# Patient Record
Sex: Male | Born: 1995 | Race: White | Hispanic: No | Marital: Married | Smoking: Never smoker
Health system: Southern US, Community
[De-identification: ages and names within clinical notes are randomized; demographics above are authoritative.]

## PROBLEM LIST (undated history)

## (undated) DIAGNOSIS — K921 Melena: Secondary | ICD-10-CM

## (undated) DIAGNOSIS — M1990 Unspecified osteoarthritis, unspecified site: Secondary | ICD-10-CM

## (undated) HISTORY — DX: Unspecified osteoarthritis, unspecified site: M19.90

## (undated) HISTORY — DX: Melena: K92.1

---
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
## 2015-10-28 ENCOUNTER — Ambulatory Visit (INDEPENDENT_AMBULATORY_CARE_PROVIDER_SITE_OTHER): Payer: Self-pay | Admitting: Physician Assistant

## 2015-10-28 VITALS — BP 120/76 | HR 55 | Temp 98.1°F | Resp 18 | Ht 70.0 in | Wt 155.0 lb

## 2015-10-28 DIAGNOSIS — L02214 Cutaneous abscess of groin: Secondary | ICD-10-CM

## 2015-10-28 MED ORDER — DOXYCYCLINE HYCLATE 100 MG PO CAPS: 100.0000 mg | ORAL_CAPSULE | Freq: Two times a day (BID) | ORAL | Status: DC

## 2015-10-28 NOTE — Patient Instructions (Addendum)
Keep area covered with a bandaid until completely healed. You can wash with soap and water but cover it up afterwards. Use heat on the affected area (you can use a sock with rice in it, heat it up in the microwave for 30-45 sec) use a barrier between your skin and the heat.   Follow up in 48 hours if no improvement, seek medical care sooner if symptoms worsen.   Take doxycycline as prescribed until the entire course is finished. Avoid direct sunlight while taking the medication; use sunscreen while outside.    Abscess An abscess is an infected area that contains a collection of pus and debris.It can occur in almost any part of the body. An abscess is also known as a furuncle or boil. CAUSES  An abscess occurs when tissue gets infected. This can occur from blockage of oil or sweat glands, infection of hair follicles, or a minor injury to the skin. As the body tries to fight the infection, pus collects in the area and creates pressure under the skin. This pressure causes pain. People with weakened immune systems have difficulty fighting infections and get certain abscesses more often.  SYMPTOMS Usually an abscess develops on the skin and becomes a painful mass that is red, warm, and tender. If the abscess forms under the skin, you may feel a moveable soft area under the skin. Some abscesses break open (rupture) on their own, but most will continue to get worse without care. The infection can spread deeper into the body and eventually into the bloodstream, causing you to feel ill.  DIAGNOSIS  Your caregiver will take your medical history and perform a physical exam. A sample of fluid may also be taken from the abscess to determine what is causing your infection. TREATMENT  Your caregiver may prescribe antibiotic medicines to fight the infection. However, taking antibiotics alone usually does not cure an abscess. Your caregiver may need to make a small cut (incision) in the abscess to drain the pus. In  some cases, gauze is packed into the abscess to reduce pain and to continue draining the area. HOME CARE INSTRUCTIONS   Only take over-the-counter or prescription medicines for pain, discomfort, or fever as directed by your caregiver.  If you were prescribed antibiotics, take them as directed. Finish them even if you start to feel better.  If gauze is used, follow your caregiver's directions for changing the gauze.  To avoid spreading the infection:  Keep your draining abscess covered with a bandage.  Wash your hands well.  Do not share personal care items, towels, or whirlpools with others.  Avoid skin contact with others.  Keep your skin and clothes clean around the abscess.  Keep all follow-up appointments as directed by your caregiver. SEEK MEDICAL CARE IF:   You have increased pain, swelling, redness, fluid drainage, or bleeding.  You have muscle aches, chills, or a general ill feeling.  You have a fever. MAKE SURE YOU:   Understand these instructions.  Will watch your condition.  Will get help right away if you are not doing well or get worse.   This information is not intended to replace advice given to you by your health care provider. Make sure you discuss any questions you have with your health care provider.   Document Released: 01/18/2005 Document Revised: 10/10/2011 Document Reviewed: 06/23/2011 Elsevier Interactive Patient Education 2016 [employer]   Doxycycline tablets or capsules What is this medicine? DOXYCYCLINE (dox i SYE kleen) is a tetracycline  antibiotic. It kills certain bacteria or stops their growth. It is used to treat many kinds of infections, like dental, skin, respiratory, and urinary tract infections. It also treats acne, Lyme disease, malaria, and certain sexually transmitted infections. This medicine may be used for other purposes; ask your health care provider or pharmacist if you have questions. What should I tell my health care  provider before I take this medicine? They need to know if you have any of these conditions: -liver disease -long exposure to sunlight like working outdoors -stomach problems like colitis -an unusual or allergic reaction to doxycycline, tetracycline antibiotics, other medicines, foods, dyes, or preservatives -pregnant or trying to get pregnant -breast-feeding How should I use this medicine? Take this medicine by mouth with a full glass of water. Follow the directions on the prescription label. It is best to take this medicine without food, but if it upsets your stomach take it with food. Take your medicine at regular intervals. Do not take your medicine more often than directed. Take all of your medicine as directed even if you think you are better. Do not skip doses or stop your medicine early. Talk to your pediatrician regarding the use of this medicine in children. While this drug may be prescribed for selected conditions, precautions do apply. Overdosage: If you think you have taken too much of this medicine contact a poison control center or emergency room at once. NOTE: This medicine is only for you. Do not share this medicine with others. What if I miss a dose? If you miss a dose, take it as soon as you can. If it is almost time for your next dose, take only that dose. Do not take double or extra doses. What may interact with this medicine? -antacids -barbiturates -birth control pills -bismuth subsalicylate -carbamazepine -methoxyflurane -other antibiotics -phenytoin -vitamins that contain iron -warfarin This list may not describe all possible interactions. Give your health care provider a list of all the medicines, herbs, non-prescription drugs, or dietary supplements you use. Also tell them if you smoke, drink alcohol, or use illegal drugs. Some items may interact with your medicine. What should I watch for while using this medicine? Tell your doctor or health care professional  if your symptoms do not improve. Do not treat diarrhea with over the counter products. Contact your doctor if you have diarrhea that lasts more than 2 days or if it is severe and watery. Do not take this medicine just before going to bed. It may not dissolve properly when you lay down and can cause pain in your throat. Drink plenty of fluids while taking this medicine to also help reduce irritation in your throat. This medicine can make you more sensitive to the sun. Keep out of the sun. If you cannot avoid being in the sun, wear protective clothing and use sunscreen. Do not use sun lamps or tanning beds/booths. Birth control pills may not work properly while you are taking this medicine. Talk to your doctor about using an extra method of birth control. If you are being treated for a sexually transmitted infection, avoid sexual contact until you have finished your treatment. Your sexual partner may also need treatment. Avoid antacids, aluminum, calcium, magnesium, and iron products for 4 hours before and 2 hours after taking a dose of this medicine. If you are using this medicine to prevent malaria, you should still protect yourself from contact with mosquitos. Stay in screened-in areas, use mosquito nets, keep your body covered, and use  an insect repellent. What side effects may I notice from receiving this medicine? Side effects that you should report to your doctor or health care professional as soon as possible: -allergic reactions like skin rash, itching or hives, swelling of the face, lips, or tongue -difficulty breathing -fever -itching in the rectal or genital area -pain on swallowing -redness, blistering, peeling or loosening of the skin, including inside the mouth -severe stomach pain or cramps -unusual bleeding or bruising -unusually weak or tired -yellowing of the eyes or skin Side effects that usually do not require medical attention (report to your doctor or health care professional  if they continue or are bothersome): -diarrhea -loss of appetite -nausea, vomiting This list may not describe all possible side effects. Call your doctor for medical advice about side effects. You may report side effects to FDA at 1-800-FDA-1088. Where should I keep my medicine? Keep out of the reach of children. Store at room temperature, below 30 degrees C (86 degrees F). Protect from light. Keep container tightly closed. Throw away any unused medicine after the expiration date. Taking this medicine after the expiration date can make you seriously ill. NOTE: This sheet is a summary. It may not cover all possible information. If you have questions about this medicine, talk to your doctor, pharmacist, or health care provider.    2016, Elsevier/Gold Standard. (2014-07-31 12:10:28)   IF you received an x-ray today, you will receive an invoice from [facility]. Please contact [facility] at [phone] with questions or concerns regarding your invoice.   IF you received labwork today, you will receive an invoice from [facility]. Please contact [facility] at [phone] with questions or concerns regarding your invoice.   Our billing staff will not be able to assist you with questions regarding bills from these companies.  You will be contacted with the lab results as soon as they are available. The fastest way to get your results is to activate your My Chart account. Instructions are located on the last page of this paperwork. If you have not heard from us regarding the results in 2 weeks, please contact this office.

## 2015-10-28 NOTE — Progress Notes (Signed)
   Shawn Strickland  MRN: [id number] DOB: [birth year]  Subjective:  Shawn Strickland  is a 20 y.o. male seen in office today for a chief complaint of cyst in right groin area that appeared about a week and a half ago. It opened by itself and a lot of pus was expressed however it has gotten progressively bigger in size. Yesterday, he noticed another one on his left thigh. He has never had anything like this before and has no history of MRSA. Pt has not tried anything for relief.   They are very painful, he has noticed a lot pus drainage from both sites. No fever, fatigue, or recent illness.   Does not report any tick or spider bites that he is aware of.   There are no active problems to display for this patient.   No current outpatient prescriptions on file prior to visit.   No current facility-administered medications on file prior to visit.    No Known Allergies  Review of Systems Objective:  BP 120/76 mmHg  Pulse 55  Temp(Src) 98.1 F (36.7 C) (Oral)  Resp 18  Ht 5\' 10"  (1.778 m)  Wt 155 lb (70.308 kg)  BMI 22.24 kg/m2  SpO2 100%  Physical Exam  Constitutional: He is oriented to person, place, and time and well-developed, well-nourished, and in no distress.  HENT:  Head: Normocephalic and atraumatic.  Eyes: Conjunctivae are normal.  Neck: Normal range of motion.  Pulmonary/Chest: Effort normal.  Neurological: He is alert and oriented to person, place, and time. Gait normal.  Skin: Skin is warm and dry.     Psychiatric: Affect normal.  Vitals reviewed.   Assessment and Plan :   1. Groin abscess -Concern for MRSA -Wound opening small, but due to adequate drainage no I&D was performed -Patient instructed if no improvement in 48 hrs to return to clinic -Informed to keep covered until completely healed and to use warm compresses on the area 3-4 times a day.  -WOUND CULTURE - doxycycline (VIBRAMYCIN) 100 MG capsule; Take 1 capsule (100 mg total) by mouth 2 (two) times  daily.  Dispense: 20 capsule; Refill: 0; informed to avoid direct sunlight and to use sunscreen while outside on this medication   Benjiman CoreBrittany Billye Pickerel PA-C  [facility] 10/28/2015 9:14 AM

## 2015-10-30 LAB — WOUND CULTURE
GRAM STAIN: NONE SEEN
Gram Stain: NONE SEEN

## 2015-11-02 ENCOUNTER — Encounter: Payer: Self-pay | Admitting: Physician Assistant

## 2016-09-01 ENCOUNTER — Emergency Department
Admission: EM | Admit: 2016-09-01 | Discharge: 2016-09-01 | Disposition: A | Discharge status: Home / Self Care | Payer: Self-pay | Attending: Emergency Medicine | Admitting: Emergency Medicine

## 2016-09-01 ENCOUNTER — Encounter: Payer: Self-pay | Admitting: Emergency Medicine

## 2016-09-01 DIAGNOSIS — K130 Diseases of lips: Secondary | ICD-10-CM | POA: Insufficient documentation

## 2016-09-01 NOTE — ED Notes (Signed)
See triage note  Presents with possible abscess area to lower lip  States area is now larger

## 2016-09-01 NOTE — ED Triage Notes (Signed)
Pt with abscess to inside lower lip for approximately one week. Pt states it popped on its own once but came back and it is now bigger.

## 2016-09-01 NOTE — ED Provider Notes (Signed)
[facility] Emergency Department Provider Note  ____________________________________________  Time seen: Approximately 3:15 PM  I have reviewed the triage vital signs and the nursing notes.   HISTORY  Chief Complaint Abscess    HPI Shawn Strickland is a [age] y.o. male that presents to the emergency department with a lip mass for 3 weeks. Patient states that a a lump keeps growing on his bottom lip. It has popped and drained twice. He stuck a safety pin in it 3 days ago and it drained clear fluid. It keeps growing back. It does not hurt.He was on web M.D. and was told that if it lasts for over 2 weeks that he should seek medical treatment. He was not able to get into the dentist for 3 weeks. He went to urgent care earlier today and was told to come to the emergency department. He is going out of town next week and wants this resolved before then. He denies fever, shortness of breath, chest pain, nausea, vomiting, abdominal pain.   History reviewed. No pertinent past medical history.  There are no active problems to display for this patient.   No past surgical history on file.  Prior to Admission medications   Medication Sig Start Date End Date Taking? Authorizing Provider  doxycycline (VIBRAMYCIN) 100 MG capsule Take 1 capsule (100 mg total) by mouth 2 (two) times daily. 10/28/15   Benjiman CoreWiseman, Brittany D, PA-C    Allergies Patient has no known allergies.  No family history on file.  Social History Social History  Substance Use Topics  . Smoking status: Never Smoker  . Smokeless tobacco: Not on file  . Alcohol use Not on file     Review of Systems  Constitutional: No fever/chills Cardiovascular: No chest pain. Respiratory: No cough. No SOB. Gastrointestinal: No abdominal pain.  No nausea, no vomiting.  Musculoskeletal: Negative for musculoskeletal pain. Skin: Negative for rash, abrasions, lacerations, ecchymosis. Neurological: Negative for headaches,  numbness or tingling   ____________________________________________   PHYSICAL EXAM:  VITAL SIGNS: ED Triage Vitals [09/01/16 1436]  Enc Vitals Group     BP 136/79     Pulse Rate 64     Resp 18     Temp 98.3 F (36.8 C)     Temp Source Oral     SpO2 98 %     Weight 165 lb (74.8 kg)     Height 5\' 9"  (1.753 m)     Head Circumference      Peak Flow      Pain Score 3     Pain Loc      Pain Edu?      Excl. in GC?      Constitutional: Alert and oriented. Well appearing and in no acute distress. Eyes: Conjunctivae are normal. PERRL. EOMI. Head: Atraumatic. ENT:      Ears:      Nose: No congestion/rhinnorhea.      Mouth/Throat: Mucous membranes are moist. There is nonerythematous. Good dentition. 1/4 cm clear vesicle on lower right lip. Non tender to palpation. No drainage. Neck: No stridor.  Cardiovascular: Normal rate, regular rhythm.  Good peripheral circulation. Respiratory: Normal respiratory effort without tachypnea or retractions. Lungs CTAB. Good air entry to the bases with no decreased or absent breath sounds. Musculoskeletal: Full range of motion to all extremities. No gross deformities appreciated. Neurologic:  Normal speech and language. No gross focal neurologic deficits are appreciated.  Skin:  Skin is warm, dry and intact. No rash  noted.   ____________________________________________   LABS (all labs ordered are listed, but only abnormal results are displayed)  Labs Reviewed - No data to display ____________________________________________  EKG   ____________________________________________  RADIOLOGY  No results found.  ____________________________________________    PROCEDURES  Procedure(s) performed:    Procedures    Medications - No data to display   ____________________________________________   INITIAL IMPRESSION / ASSESSMENT AND PLAN / ED COURSE  Pertinent labs & imaging results that were available during my care of the  patient were reviewed by me and considered in my medical decision making (see chart for details).  Review of the [place] CSRS was performed in accordance of the [facility] prior to dispensing any controlled drugs.     Patient's diagnosis is consistent with cyst on lip. Vital signs and exam are reassuring. Cyst drained 3 times in the last 3 weeks so I do not think the patient will benefit from drainage in the ED. It is nontender and he is afebrile so I am not concerned for infection. Patient is to follow up with oral surgery as directed. Patient is given ED precautions to return to the ED for any worsening or new symptoms.     ____________________________________________  FINAL CLINICAL IMPRESSION(S) / ED DIAGNOSES  Final diagnoses:  Cyst of lip      NEW MEDICATIONS STARTED DURING THIS VISIT:  Discharge Medication List as of 09/01/2016  3:25 PM          This chart was dictated using voice recognition software/Dragon. Despite best efforts to proofread, errors can occur which can change the meaning. Any change was purely unintentional.    Enid Derry, PA-C 09/02/16 4098    Nita Sickle, MD 09/04/16 1324

## 2017-02-21 ENCOUNTER — Emergency Department
Admission: EM | Admit: 2017-02-21 | Discharge: 2017-02-21 | Disposition: A | Discharge status: Home / Self Care | Payer: Self-pay | Attending: Emergency Medicine | Admitting: Emergency Medicine

## 2017-02-21 ENCOUNTER — Emergency Department: Payer: Self-pay

## 2017-02-21 DIAGNOSIS — W230XXA Caught, crushed, jammed, or pinched between moving objects, initial encounter: Secondary | ICD-10-CM | POA: Insufficient documentation

## 2017-02-21 DIAGNOSIS — S61210A Laceration without foreign body of right index finger without damage to nail, initial encounter: Secondary | ICD-10-CM | POA: Insufficient documentation

## 2017-02-21 DIAGNOSIS — Y990 Civilian activity done for income or pay: Secondary | ICD-10-CM | POA: Insufficient documentation

## 2017-02-21 DIAGNOSIS — Y9389 Activity, other specified: Secondary | ICD-10-CM | POA: Insufficient documentation

## 2017-02-21 DIAGNOSIS — Z79899 Other long term (current) drug therapy: Secondary | ICD-10-CM | POA: Insufficient documentation

## 2017-02-21 DIAGNOSIS — Y9259 Other trade areas as the place of occurrence of the external cause: Secondary | ICD-10-CM | POA: Insufficient documentation

## 2017-02-21 MED ORDER — CEPHALEXIN 500 MG PO CAPS: 500.0000 mg | ORAL_CAPSULE | Freq: Four times a day (QID) | ORAL | 0 refills | Status: AC

## 2017-02-21 MED ORDER — HYDROCODONE-ACETAMINOPHEN 5-325 MG PO TABS: 1.0000 | ORAL_TABLET | Freq: Four times a day (QID) | ORAL | 0 refills | Status: DC | PRN

## 2017-02-21 MED ORDER — LIDOCAINE HCL (PF) 1 % IJ SOLN
INTRAMUSCULAR | Status: AC
  Administered 2017-02-21: 5 mL via INTRADERMAL
  Filled 2017-02-21: qty 5

## 2017-02-21 MED ORDER — HYDROCODONE-ACETAMINOPHEN 5-325 MG PO TABS
1.0000 | ORAL_TABLET | Freq: Once | ORAL | Status: AC
  Administered 2017-02-21: 1 via ORAL
  Filled 2017-02-21: qty 1

## 2017-02-21 MED ORDER — LIDOCAINE HCL (PF) 1 % IJ SOLN
5.0000 mL | Freq: Once | INTRAMUSCULAR | Status: AC
  Administered 2017-02-21: 5 mL via INTRADERMAL

## 2017-02-21 NOTE — ED Triage Notes (Signed)
Pt states he got his left pointer finger caught in between a grinder wheel and the shield today. Bleeding controlled at this time.

## 2017-02-21 NOTE — ED Notes (Signed)
NAD noted at time of D/C. Pt denies questions or concerns. Pt ambulatory to the lobby at this time.  

## 2017-02-21 NOTE — ED Provider Notes (Signed)
[facility] Emergency Department Provider Note   ____________________________________________   I have reviewed the triage vital signs and the nursing notes.   HISTORY  Chief Complaint Finger Injury    HPI Shawn Strickland is a 21 y.o. 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  Patient reported his finger became caught between the grinder and the metal tearing the tissue along the pad of the distal phalanx.  Patient reported heavy hemorrhaging that he quickly maintained.  He noted intact movement of the index finger following the injury as well as sensation. Patient denies fever, chills, headache, vision changes, chest pain, chest tightness, shortness of breath, abdominal pain, nausea and vomiting.  History reviewed. No pertinent past medical history.  There are no active problems to display for this patient.   History reviewed. No pertinent surgical history.  Prior to Admission medications   Medication Sig Start Date End Date Taking? Authorizing Provider  cephALEXin (KEFLEX) 500 MG capsule Take 1 capsule (500 mg total) by mouth 4 (four) times daily. 02/21/17 02/26/17  Little, Traci M, PA-C  doxycycline (VIBRAMYCIN) 100 MG capsule Take 1 capsule (100 mg total) by mouth 2 (two) times daily. 10/28/15   Benjiman CoreWiseman, Brittany D, PA-C  HYDROcodone-acetaminophen (NORCO/VICODIN) 5-325 MG tablet Take 1 tablet by mouth every 6 (six) hours as needed for moderate pain. 02/21/17   Little, Traci M, PA-C    Allergies Patient has no known allergies.  No family history on file.  Social History Social History  Substance Use Topics  . Smoking status: Never Smoker  . Smokeless tobacco: Never Used  . Alcohol use No    Review of Systems Constitutional: Negative for fever/chills Cardiovascular: Denies chest  pain. Respiratory: Denies shortness of breath. Musculoskeletal: Positive for right index finger laceration and pain. Skin: Negative for rash. Neurological: Negative for headaches.  ____________________________________________   PHYSICAL EXAM:  VITAL SIGNS: ED Triage Vitals  Enc Vitals Group     BP 02/21/17 1017 134/65     Pulse Rate 02/21/17 1017 63     Resp 02/21/17 1017 16     Temp 02/21/17 1017 98.2 F (36.8 C)     Temp Source 02/21/17 1017 Oral     SpO2 02/21/17 1017 99 %     Weight 02/21/17 1009 163 lb (73.9 kg)     Height 02/21/17 1009 5\' 10"  (1.778 m)     Head Circumference --      Peak Flow --      Pain Score 02/21/17 1009 6     Pain Loc --      Pain Edu? --      Excl. in GC? --     Constitutional: Alert and oriented. Well appearing and in no acute distress.  Head: Normocephalic and atraumatic. Cardiovascular: Normal rate, regular rhythm. Respiratory: Normal respiratory effort without tachypnea or retractions.  Musculoskeletal: Right hand index finger injury to the distal phalanx.  Avulsion of the distal phalanx skin along the entire pad.  Intact movement of the DIP and IP joints.  Skin along left index finger distal phalanx pad completely avulsed off during the injury. Intact DIP, IP and MCP ROM/movement of the left index finger.  Neurologic: Normal speech and language.  Skin:  Skin is warm, dry and intact. No rash noted. Avulsed skin injury area: 1.25 cm x 2  cm.  Psychiatric: Mood and affect are normal. Speech and behavior are normal. Patient exhibits appropriate insight and judgement.  ____________________________________________   LABS (all labs ordered are listed, but only abnormal results are displayed)  Labs Reviewed - No data to display ____________________________________________  EKG none ____________________________________________  RADIOLOGY DG right hand complete FINDINGS: Soft tissue irregularity near the tip of the left index finger.  No radiopaque foreign bodies. No underlying bony abnormality. No fracture, subluxation or dislocation. Joint spaces are maintained.  IMPRESSION: No bony abnormality or radiopaque foreign body. ____________________________________________   PROCEDURES  Procedure(s) performed:  LACERATION REPAIR Performed by: Clois Comber Authorized by: Clois Comber Consent: Verbal consent obtained. Risks and benefits: risks, benefits and alternatives were discussed Consent given by: patient Patient identity confirmed: provided demographic data Prepped and Draped in normal sterile fashion Wound explored  Laceration Location: Right hand index finger.  Laceration Length: 1 cm x 1.5 cm.  No Foreign Bodies seen or palpated  Anesthesia: local infiltration and Transthecal block  Local anesthetic: lidocaine 1%  Anesthetic total: 4.0 ml  Irrigation method: syringe Amount of cleaning: standard  Debriding of minor dirt, dust and jagged skin.   Skin closure: Surgi-cel, covered with xero-form then 2x2 gauze and rolled guaze. Supported with finger splint.   Patient tolerance: Patient tolerated the procedure well with no immediate complications.    Critical Care performed: no ____________________________________________   INITIAL IMPRESSION / ASSESSMENT AND PLAN / ED COURSE  Pertinent labs & imaging results that were available during my care of the patient were reviewed by me and considered in my medical decision making (see chart for details).  Patient presents to emergency department with avulsion/laceration of the distal phalanx of the left index finger.  Imaging was unremarkable for fracture, subluxation, dislocation or foreign body.  Assessment confirmed movement and sensation of the digit before and after wound closure. Laceration/avulsion injury required closure as noted above. Patient tolerated procedure well. Pt instructed to keep wound clean, dry and covered for the 3-4 days.  Patient prescribed cephalexin for antibiotic coverage. Patient also instructed to watch for signs of infection or re-hemorrhaging, given strict return precautions. Patient informed of clinical course, understand medical decision-making process, and agree with plan. Patient was advised to follow up with PCP as needed and was also advised to return to the emergency department for symptoms that change or worsen.      ____________________________________________   FINAL CLINICAL IMPRESSION(S) / ED DIAGNOSES  Final diagnoses:  Laceration of right index finger without foreign body without damage to nail, initial encounter       NEW MEDICATIONS STARTED DURING THIS VISIT:  Discharge Medication List as of 02/21/2017 11:49 AM    START taking these medications   Details  cephALEXin (KEFLEX) 500 MG capsule Take 1 capsule (500 mg total) by mouth 4 (four) times daily., Starting Wed 02/21/2017, Until Mon 02/26/2017, Print    HYDROcodone-acetaminophen (NORCO/VICODIN) 5-325 MG tablet Take 1 tablet by mouth every 6 (six) hours as needed for moderate pain., Starting Wed 02/21/2017, Print         Note:  This document was prepared using Dragon voice recognition software and may include unintentional dictation errors.    Clois Comber, PA-C 02/21/17 1658    Governor Rooks, MD 02/23/17 [phone]

## 2017-02-21 NOTE — Discharge Instructions (Signed)
Keep wound clean dry and covered for the next 2-3 days.  Allow the clotting material to fall off naturally.  If wound area begins to bleed and you are unable to control bleeding do not hesitate to return to the emergency department.

## 2017-03-03 ENCOUNTER — Encounter: Payer: Self-pay | Admitting: Emergency Medicine

## 2017-03-03 ENCOUNTER — Emergency Department
Admission: EM | Admit: 2017-03-03 | Discharge: 2017-03-03 | Disposition: A | Discharge status: Home / Self Care | Payer: Self-pay | Attending: Emergency Medicine | Admitting: Emergency Medicine

## 2017-03-03 ENCOUNTER — Other Ambulatory Visit: Payer: Self-pay

## 2017-03-03 DIAGNOSIS — Z5321 Procedure and treatment not carried out due to patient leaving prior to being seen by health care provider: Secondary | ICD-10-CM | POA: Insufficient documentation

## 2017-03-03 DIAGNOSIS — Z4800 Encounter for change or removal of nonsurgical wound dressing: Secondary | ICD-10-CM | POA: Insufficient documentation

## 2017-03-03 NOTE — ED Notes (Signed)
PA went in to assess patient, patient is gone with no belongings left in room.  Per PA put as elopement.
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
## 2019-12-13 ENCOUNTER — Other Ambulatory Visit: Payer: Self-pay

## 2019-12-13 ENCOUNTER — Emergency Department (HOSPITAL_COMMUNITY): Payer: BC Managed Care – PPO

## 2019-12-13 ENCOUNTER — Inpatient Hospital Stay (HOSPITAL_COMMUNITY)
Admission: EM | Admit: 2019-12-13 | Discharge: 2019-12-25 | DRG: 464 | Disposition: A | Discharge status: Home / Self Care | Payer: BC Managed Care – PPO | Attending: General Surgery | Admitting: General Surgery

## 2019-12-13 DIAGNOSIS — S41102A Unspecified open wound of left upper arm, initial encounter: Secondary | ICD-10-CM | POA: Diagnosis present

## 2019-12-13 DIAGNOSIS — S0240FA Zygomatic fracture, left side, initial encounter for closed fracture: Secondary | ICD-10-CM | POA: Diagnosis present

## 2019-12-13 DIAGNOSIS — S31109A Unspecified open wound of abdominal wall, unspecified quadrant without penetration into peritoneal cavity, initial encounter: Secondary | ICD-10-CM | POA: Diagnosis present

## 2019-12-13 DIAGNOSIS — S71101A Unspecified open wound, right thigh, initial encounter: Secondary | ICD-10-CM | POA: Diagnosis present

## 2019-12-13 DIAGNOSIS — S52611A Displaced fracture of right ulna styloid process, initial encounter for closed fracture: Secondary | ICD-10-CM | POA: Diagnosis present

## 2019-12-13 DIAGNOSIS — S022XXA Fracture of nasal bones, initial encounter for closed fracture: Secondary | ICD-10-CM | POA: Diagnosis present

## 2019-12-13 DIAGNOSIS — S91102A Unspecified open wound of left great toe without damage to nail, initial encounter: Secondary | ICD-10-CM | POA: Diagnosis present

## 2019-12-13 DIAGNOSIS — F17210 Nicotine dependence, cigarettes, uncomplicated: Secondary | ICD-10-CM | POA: Diagnosis present

## 2019-12-13 DIAGNOSIS — S41101A Unspecified open wound of right upper arm, initial encounter: Secondary | ICD-10-CM | POA: Diagnosis present

## 2019-12-13 DIAGNOSIS — S52571A Other intraarticular fracture of lower end of right radius, initial encounter for closed fracture: Secondary | ICD-10-CM | POA: Diagnosis not present

## 2019-12-13 DIAGNOSIS — S91104A Unspecified open wound of right lesser toe(s) without damage to nail, initial encounter: Secondary | ICD-10-CM | POA: Diagnosis present

## 2019-12-13 DIAGNOSIS — S060X9A Concussion with loss of consciousness of unspecified duration, initial encounter: Secondary | ICD-10-CM | POA: Diagnosis present

## 2019-12-13 DIAGNOSIS — S0240DA Maxillary fracture, left side, initial encounter for closed fracture: Secondary | ICD-10-CM | POA: Diagnosis present

## 2019-12-13 DIAGNOSIS — S0292XA Unspecified fracture of facial bones, initial encounter for closed fracture: Secondary | ICD-10-CM | POA: Diagnosis present

## 2019-12-13 DIAGNOSIS — Z20822 Contact with and (suspected) exposure to covid-19: Secondary | ICD-10-CM | POA: Diagnosis present

## 2019-12-13 DIAGNOSIS — S71102A Unspecified open wound, left thigh, initial encounter: Secondary | ICD-10-CM | POA: Diagnosis present

## 2019-12-13 DIAGNOSIS — S0081XA Abrasion of other part of head, initial encounter: Secondary | ICD-10-CM | POA: Diagnosis present

## 2019-12-13 DIAGNOSIS — Z419 Encounter for procedure for purposes other than remedying health state, unspecified: Secondary | ICD-10-CM

## 2019-12-13 DIAGNOSIS — T1490XA Injury, unspecified, initial encounter: Secondary | ICD-10-CM

## 2019-12-13 DIAGNOSIS — H1132 Conjunctival hemorrhage, left eye: Secondary | ICD-10-CM | POA: Diagnosis present

## 2019-12-13 DIAGNOSIS — S91105A Unspecified open wound of left lesser toe(s) without damage to nail, initial encounter: Secondary | ICD-10-CM | POA: Diagnosis present

## 2019-12-13 LAB — ETHANOL: Alcohol, Ethyl (B): 120 mg/dL — ABNORMAL HIGH (ref <10)

## 2019-12-13 LAB — SAMPLE TO BLOOD BANK

## 2019-12-13 MED ORDER — SODIUM CHLORIDE 0.9 % IV BOLUS
125.0000 mL | Freq: Once | INTRAVENOUS | Status: AC
  Administered 2019-12-14: 125 mL via INTRAVENOUS

## 2019-12-13 MED ORDER — SODIUM CHLORIDE 0.9 % IV SOLN
INTRAVENOUS | Status: AC | PRN
  Administered 2019-12-13: 1000 mL via INTRAVENOUS

## 2019-12-13 MED ORDER — HYDROMORPHONE HCL 1 MG/ML IJ SOLN: 2.0000 mg | Freq: Once | INTRAMUSCULAR | Status: DC

## 2019-12-13 MED ORDER — CEFAZOLIN SODIUM-DEXTROSE 2-4 GM/100ML-% IV SOLN
2.0000 g | Freq: Once | INTRAVENOUS | Status: AC
  Administered 2019-12-14: 2 g via INTRAVENOUS
  Filled 2019-12-13: qty 100

## 2019-12-13 MED ORDER — HYDROMORPHONE HCL 1 MG/ML IJ SOLN
1.0000 mg | INTRAMUSCULAR | Status: DC | PRN
  Administered 2019-12-14 (×3): 1 mg via INTRAVENOUS
  Filled 2019-12-13 (×4): qty 1

## 2019-12-13 MED ORDER — HYDROMORPHONE HCL 1 MG/ML IJ SOLN
INTRAMUSCULAR | Status: AC
  Administered 2019-12-13: 1 mg
  Administered 2019-12-14: 1 mg via INTRAVENOUS
  Filled 2019-12-13: qty 1

## 2019-12-13 MED ORDER — IOHEXOL 300 MG/ML  SOLN
100.0000 mL | Freq: Once | INTRAMUSCULAR | Status: AC | PRN
  Administered 2019-12-13: 100 mL via INTRAVENOUS
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
## 2019-12-14 ENCOUNTER — Inpatient Hospital Stay (HOSPITAL_COMMUNITY): Payer: BC Managed Care – PPO

## 2019-12-14 ENCOUNTER — Emergency Department (HOSPITAL_COMMUNITY): Payer: BC Managed Care – PPO

## 2019-12-14 DIAGNOSIS — S0292XA Unspecified fracture of facial bones, initial encounter for closed fracture: Secondary | ICD-10-CM | POA: Diagnosis present

## 2019-12-14 DIAGNOSIS — S52611A Displaced fracture of right ulna styloid process, initial encounter for closed fracture: Secondary | ICD-10-CM | POA: Diagnosis present

## 2019-12-14 DIAGNOSIS — H1132 Conjunctival hemorrhage, left eye: Secondary | ICD-10-CM | POA: Diagnosis present

## 2019-12-14 DIAGNOSIS — S71102A Unspecified open wound, left thigh, initial encounter: Secondary | ICD-10-CM | POA: Diagnosis present

## 2019-12-14 DIAGNOSIS — S31109A Unspecified open wound of abdominal wall, unspecified quadrant without penetration into peritoneal cavity, initial encounter: Secondary | ICD-10-CM | POA: Diagnosis present

## 2019-12-14 DIAGNOSIS — S0240DA Maxillary fracture, left side, initial encounter for closed fracture: Secondary | ICD-10-CM | POA: Diagnosis present

## 2019-12-14 DIAGNOSIS — Z20822 Contact with and (suspected) exposure to covid-19: Secondary | ICD-10-CM | POA: Diagnosis present

## 2019-12-14 DIAGNOSIS — S91105A Unspecified open wound of left lesser toe(s) without damage to nail, initial encounter: Secondary | ICD-10-CM | POA: Diagnosis present

## 2019-12-14 DIAGNOSIS — S0240FA Zygomatic fracture, left side, initial encounter for closed fracture: Secondary | ICD-10-CM | POA: Diagnosis present

## 2019-12-14 DIAGNOSIS — T1490XA Injury, unspecified, initial encounter: Secondary | ICD-10-CM | POA: Diagnosis present

## 2019-12-14 DIAGNOSIS — S41101A Unspecified open wound of right upper arm, initial encounter: Secondary | ICD-10-CM | POA: Diagnosis present

## 2019-12-14 DIAGNOSIS — S71101A Unspecified open wound, right thigh, initial encounter: Secondary | ICD-10-CM | POA: Diagnosis present

## 2019-12-14 DIAGNOSIS — S0081XA Abrasion of other part of head, initial encounter: Secondary | ICD-10-CM | POA: Diagnosis present

## 2019-12-14 DIAGNOSIS — S91104A Unspecified open wound of right lesser toe(s) without damage to nail, initial encounter: Secondary | ICD-10-CM | POA: Diagnosis present

## 2019-12-14 DIAGNOSIS — S52571A Other intraarticular fracture of lower end of right radius, initial encounter for closed fracture: Secondary | ICD-10-CM | POA: Diagnosis present

## 2019-12-14 DIAGNOSIS — S022XXA Fracture of nasal bones, initial encounter for closed fracture: Secondary | ICD-10-CM | POA: Diagnosis present

## 2019-12-14 DIAGNOSIS — F17210 Nicotine dependence, cigarettes, uncomplicated: Secondary | ICD-10-CM | POA: Diagnosis present

## 2019-12-14 DIAGNOSIS — S060X9A Concussion with loss of consciousness of unspecified duration, initial encounter: Secondary | ICD-10-CM | POA: Diagnosis present

## 2019-12-14 DIAGNOSIS — S91102A Unspecified open wound of left great toe without damage to nail, initial encounter: Secondary | ICD-10-CM | POA: Diagnosis present

## 2019-12-14 DIAGNOSIS — S41102A Unspecified open wound of left upper arm, initial encounter: Secondary | ICD-10-CM | POA: Diagnosis present

## 2019-12-14 LAB — CBC
HCT: 48.2 % (ref 39.0–52.0)
Hemoglobin: 16.1 g/dL (ref 13.0–17.0)
MCH: 30.6 pg (ref 26.0–34.0)
MCHC: 33.4 g/dL (ref 30.0–36.0)
MCV: 91.5 fL (ref 80.0–100.0)
Platelets: 366 10*3/uL (ref 150–400)
RBC: 5.27 MIL/uL (ref 4.22–5.81)
RDW: 12.6 % (ref 11.5–15.5)
WBC: 18.8 10*3/uL — ABNORMAL HIGH (ref 4.0–10.5)
nRBC: 0 % (ref 0.0–0.2)

## 2019-12-14 LAB — COMPREHENSIVE METABOLIC PANEL
ALT: 16 U/L (ref 0–44)
AST: 28 U/L (ref 15–41)
Albumin: 2.2 g/dL — ABNORMAL LOW (ref 3.5–5.0)
Alkaline Phosphatase: 42 U/L (ref 38–126)
Anion gap: 10 (ref 5–15)
BUN: 8 mg/dL (ref 6–20)
CO2: 12 mmol/L — ABNORMAL LOW (ref 22–32)
Calcium: 4.9 mg/dL — CL (ref 8.9–10.3)
Chloride: 122 mmol/L — ABNORMAL HIGH (ref 98–111)
Creatinine, Ser: 0.68 mg/dL (ref 0.61–1.24)
GFR calc Af Amer: >60 mL/min (ref >60)
GFR calc non Af Amer: >60 mL/min (ref >60)
Glucose, Bld: 83 mg/dL (ref 70–99)
Potassium: 2.3 mmol/L — CL (ref 3.5–5.1)
Sodium: 144 mmol/L (ref 135–145)
Total Bilirubin: 0.3 mg/dL (ref 0.3–1.2)
Total Protein: 3.7 g/dL — ABNORMAL LOW (ref 6.5–8.1)

## 2019-12-14 LAB — PROTIME-INR
INR: 1 (ref 0.8–1.2)
Prothrombin Time: 12.5 seconds (ref 11.4–15.2)

## 2019-12-14 LAB — URINALYSIS, ROUTINE W REFLEX MICROSCOPIC
Bacteria, UA: NONE SEEN
Bilirubin Urine: NEGATIVE
Glucose, UA: NEGATIVE mg/dL
Ketones, ur: NEGATIVE mg/dL
Leukocytes,Ua: NEGATIVE
Nitrite: NEGATIVE
Protein, ur: 30 mg/dL — AB
Specific Gravity, Urine: 1.04 — ABNORMAL HIGH (ref 1.005–1.030)
pH: 5 (ref 5.0–8.0)

## 2019-12-14 LAB — LACTIC ACID, PLASMA
Lactic Acid, Venous: 3.1 mmol/L (ref 0.5–1.9)
Lactic Acid, Venous: 2.2 mmol/L (ref 0.5–1.9)

## 2019-12-14 LAB — BASIC METABOLIC PANEL
Anion gap: 16 — ABNORMAL HIGH (ref 5–15)
BUN: 13 mg/dL (ref 6–20)
CO2: 15 mmol/L — ABNORMAL LOW (ref 22–32)
Calcium: 8.6 mg/dL — ABNORMAL LOW (ref 8.9–10.3)
Chloride: 109 mmol/L (ref 98–111)
Creatinine, Ser: 1.06 mg/dL (ref 0.61–1.24)
GFR calc Af Amer: >60 mL/min (ref >60)
GFR calc non Af Amer: >60 mL/min (ref >60)
Glucose, Bld: 182 mg/dL — ABNORMAL HIGH (ref 70–99)
Potassium: 4.9 mmol/L (ref 3.5–5.1)
Sodium: 140 mmol/L (ref 135–145)

## 2019-12-14 LAB — SARS CORONAVIRUS 2 BY RT PCR (HOSPITAL ORDER, PERFORMED IN ~~LOC~~ HOSPITAL LAB): SARS Coronavirus 2: NEGATIVE

## 2019-12-14 LAB — HIV ANTIBODY (ROUTINE TESTING W REFLEX): HIV Screen 4th Generation wRfx: NONREACTIVE

## 2019-12-14 MED ORDER — SODIUM CHLORIDE 0.9 % IV SOLN: INTRAVENOUS | Status: DC

## 2019-12-14 MED ORDER — ACETAMINOPHEN 325 MG PO TABS
650.0000 mg | ORAL_TABLET | Freq: Four times a day (QID) | ORAL | Status: DC
  Administered 2019-12-14 – 2019-12-25 (×36): 650 mg via ORAL
  Filled 2019-12-14 (×36): qty 2

## 2019-12-14 MED ORDER — FENTANYL CITRATE (PF) 100 MCG/2ML IJ SOLN: 100.0000 ug | Freq: Once | INTRAMUSCULAR | Status: AC

## 2019-12-14 MED ORDER — ENOXAPARIN SODIUM 30 MG/0.3ML ~~LOC~~ SOLN
30.0000 mg | Freq: Two times a day (BID) | SUBCUTANEOUS | Status: DC
  Administered 2019-12-14 – 2019-12-25 (×14): 30 mg via SUBCUTANEOUS
  Filled 2019-12-14 (×21): qty 0.3

## 2019-12-14 MED ORDER — ETOMIDATE 2 MG/ML IV SOLN
10.0000 mg | Freq: Once | INTRAVENOUS | Status: AC
  Administered 2019-12-14: 10 mg via INTRAVENOUS
  Filled 2019-12-14: qty 10

## 2019-12-14 MED ORDER — IBUPROFEN 600 MG PO TABS
600.0000 mg | ORAL_TABLET | Freq: Four times a day (QID) | ORAL | Status: DC | PRN
  Administered 2019-12-15 – 2019-12-23 (×5): 600 mg via ORAL
  Filled 2019-12-14 (×5): qty 1

## 2019-12-14 MED ORDER — SODIUM CHLORIDE 0.9 % IV SOLN: Freq: Once | INTRAVENOUS | Status: AC

## 2019-12-14 MED ORDER — FENTANYL CITRATE (PF) 100 MCG/2ML IJ SOLN
INTRAMUSCULAR | Status: AC
  Administered 2019-12-14: 100 ug via INTRAVENOUS
  Filled 2019-12-14: qty 2

## 2019-12-14 MED ORDER — OXYCODONE HCL 5 MG PO TABS
5.0000 mg | ORAL_TABLET | ORAL | Status: DC | PRN
  Filled 2019-12-14: qty 1

## 2019-12-14 MED ORDER — PANTOPRAZOLE SODIUM 40 MG PO TBEC
40.0000 mg | DELAYED_RELEASE_TABLET | Freq: Every day | ORAL | Status: DC
  Administered 2019-12-16 – 2019-12-25 (×10): 40 mg via ORAL
  Filled 2019-12-14 (×10): qty 1

## 2019-12-14 MED ORDER — BACITRACIN-NEOMYCIN-POLYMYXIN OINTMENT TUBE
TOPICAL_OINTMENT | Freq: Two times a day (BID) | CUTANEOUS | Status: DC
  Administered 2019-12-14: 1 via TOPICAL
  Filled 2019-12-14 (×2): qty 14

## 2019-12-14 MED ORDER — ONDANSETRON HCL 4 MG/2ML IJ SOLN
4.0000 mg | Freq: Four times a day (QID) | INTRAMUSCULAR | Status: DC | PRN
  Administered 2019-12-14 – 2019-12-19 (×7): 4 mg via INTRAVENOUS
  Filled 2019-12-14 (×7): qty 2

## 2019-12-14 MED ORDER — AMOXICILLIN-POT CLAVULANATE 875-125 MG PO TABS
1.0000 | ORAL_TABLET | Freq: Two times a day (BID) | ORAL | Status: AC
  Administered 2019-12-14 – 2019-12-21 (×16): 1 via ORAL
  Filled 2019-12-14 (×16): qty 1

## 2019-12-14 MED ORDER — ONDANSETRON 4 MG PO TBDP
4.0000 mg | ORAL_TABLET | Freq: Four times a day (QID) | ORAL | Status: DC | PRN
  Administered 2019-12-17 – 2019-12-19 (×2): 4 mg via ORAL
  Filled 2019-12-14 (×2): qty 1

## 2019-12-14 MED ORDER — PANTOPRAZOLE SODIUM 40 MG IV SOLR
40.0000 mg | Freq: Every day | INTRAVENOUS | Status: DC
  Administered 2019-12-14 – 2019-12-15 (×2): 40 mg via INTRAVENOUS
  Filled 2019-12-14 (×7): qty 40

## 2019-12-14 MED ORDER — HYDROMORPHONE HCL 1 MG/ML IJ SOLN
0.5000 mg | INTRAMUSCULAR | Status: DC | PRN
  Administered 2019-12-15 – 2019-12-21 (×32): 1 mg via INTRAVENOUS
  Filled 2019-12-14 (×34): qty 1

## 2019-12-14 MED ORDER — POTASSIUM CHLORIDE IN NACL 20-0.9 MEQ/L-% IV SOLN
INTRAVENOUS | Status: DC
  Filled 2019-12-14: qty 1000

## 2019-12-14 MED ORDER — OXYCODONE HCL 5 MG PO TABS
10.0000 mg | ORAL_TABLET | ORAL | Status: DC | PRN
  Administered 2019-12-14 – 2019-12-18 (×9): 10 mg via ORAL
  Filled 2019-12-14 (×9): qty 2

## 2019-12-14 MED ORDER — METHOCARBAMOL 1000 MG/10ML IJ SOLN
1000.0000 mg | Freq: Three times a day (TID) | INTRAVENOUS | Status: DC | PRN
  Administered 2019-12-15 – 2019-12-17 (×4): 1000 mg via INTRAVENOUS
  Filled 2019-12-14 (×4): qty 10

## 2019-12-14 MED ORDER — GABAPENTIN 100 MG PO CAPS
100.0000 mg | ORAL_CAPSULE | Freq: Three times a day (TID) | ORAL | Status: DC
  Administered 2019-12-14 – 2019-12-21 (×20): 100 mg via ORAL
  Filled 2019-12-14 (×20): qty 1

## 2019-12-14 NOTE — ED Notes (Signed)
Pt attempted to use urinal with assistance and was unable to do so. Pt agreed to condom cath.
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
## 2019-12-14 NOTE — Sedation Documentation (Signed)
Rt, ortho tech, ED tech, Dr. Clayborne Dana, and this RN at bedside

## 2019-12-14 NOTE — Sedation Documentation (Signed)
Dr. Clayborne Dana reducing wrist at bedside

## 2019-12-14 NOTE — ED Notes (Signed)
Mother at bedside, Efraim Kaufmann -- [phone]. Consent signed for OR tomorrow by mother, pt is resting and unable to use hands
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
## 2019-12-14 NOTE — ED Notes (Signed)
Maxilofacial surgery and hand surgery at bedside
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
## 2019-12-14 NOTE — ED Notes (Signed)
Spoke with  Mother on the phone, she is on the way now.

## 2019-12-14 NOTE — ED Notes (Signed)
Report called to 6N
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
## 2019-12-14 NOTE — Progress Notes (Signed)
PT Cancellation Note  Patient Details Name: Shawn Strickland MRN: [id number] DOB: 1995/11/22   Cancelled Treatment:    Reason Eval/Treat Not Completed: Patient declined, no reason specified.  Pt asked for therapist to try back later if able. 12/14/2019  Jacinto Halim., PT Acute Rehabilitation Services [phone]  (pager) [phone]  (office)   [name] 12/14/2019, 12:15 PM
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
## 2019-12-15 ENCOUNTER — Inpatient Hospital Stay (HOSPITAL_COMMUNITY): Payer: BC Managed Care – PPO | Admitting: Anesthesiology

## 2019-12-15 ENCOUNTER — Inpatient Hospital Stay (HOSPITAL_COMMUNITY): Payer: BC Managed Care – PPO

## 2019-12-15 ENCOUNTER — Encounter (HOSPITAL_COMMUNITY): Payer: Self-pay | Admitting: General Surgery

## 2019-12-15 ENCOUNTER — Encounter (HOSPITAL_COMMUNITY): Admission: EM | Disposition: A | Discharge status: Home / Self Care | Payer: Self-pay | Source: Home / Self Care

## 2019-12-15 HISTORY — PX: INCISION AND DRAINAGE OF WOUND: SHX1803

## 2019-12-15 HISTORY — PX: EXTERNAL FIXATION ARM: SHX1552

## 2019-12-15 LAB — BASIC METABOLIC PANEL
Anion gap: 10 (ref 5–15)
BUN: 12 mg/dL (ref 6–20)
CO2: 20 mmol/L — ABNORMAL LOW (ref 22–32)
Calcium: 8.3 mg/dL — ABNORMAL LOW (ref 8.9–10.3)
Chloride: 105 mmol/L (ref 98–111)
Creatinine, Ser: 1.08 mg/dL (ref 0.61–1.24)
GFR calc Af Amer: >60 mL/min (ref >60)
GFR calc non Af Amer: >60 mL/min (ref >60)
Glucose, Bld: 131 mg/dL — ABNORMAL HIGH (ref 70–99)
Potassium: 4.5 mmol/L (ref 3.5–5.1)
Sodium: 135 mmol/L (ref 135–145)

## 2019-12-15 LAB — CBC
HCT: 47 % (ref 39.0–52.0)
Hemoglobin: 15.5 g/dL (ref 13.0–17.0)
MCH: 29.6 pg (ref 26.0–34.0)
MCHC: 33 g/dL (ref 30.0–36.0)
MCV: 89.9 fL (ref 80.0–100.0)
Platelets: 256 10*3/uL (ref 150–400)
RBC: 5.23 MIL/uL (ref 4.22–5.81)
RDW: 12.9 % (ref 11.5–15.5)
WBC: 8 10*3/uL (ref 4.0–10.5)
nRBC: 0 % (ref 0.0–0.2)

## 2019-12-15 LAB — SURGICAL PCR SCREEN
MRSA, PCR: NEGATIVE
Staphylococcus aureus: NEGATIVE

## 2019-12-15 SURGERY — EXTERNAL FIXATION, UPPER EXTREMITY
Anesthesia: General | Site: Wrist | Laterality: Right

## 2019-12-15 MED ORDER — MIDAZOLAM HCL 2 MG/2ML IJ SOLN
INTRAMUSCULAR | Status: DC | PRN
  Administered 2019-12-15: 2 mg via INTRAVENOUS

## 2019-12-15 MED ORDER — FENTANYL CITRATE (PF) 250 MCG/5ML IJ SOLN
INTRAMUSCULAR | Status: DC | PRN
Start: 2019-12-15 — End: 2019-12-15
  Administered 2019-12-15 (×2): 50 ug via INTRAVENOUS
  Administered 2019-12-15: 100 ug via INTRAVENOUS
  Administered 2019-12-15 (×3): 50 ug via INTRAVENOUS

## 2019-12-15 MED ORDER — LIDOCAINE 2% (20 MG/ML) 5 ML SYRINGE
INTRAMUSCULAR | Status: DC | PRN
  Administered 2019-12-15: 60 mg via INTRAVENOUS

## 2019-12-15 MED ORDER — 0.9 % SODIUM CHLORIDE (POUR BTL) OPTIME
TOPICAL | Status: DC | PRN
  Administered 2019-12-15 (×2): 1000 mL

## 2019-12-15 MED ORDER — HYDROMORPHONE HCL 1 MG/ML IJ SOLN
INTRAMUSCULAR | Status: DC | PRN
  Administered 2019-12-15 (×2): .5 mg via INTRAVENOUS

## 2019-12-15 MED ORDER — ACETAMINOPHEN 500 MG PO TABS: 1000.0000 mg | ORAL_TABLET | Freq: Once | ORAL | Status: DC

## 2019-12-15 MED ORDER — PROPOFOL 10 MG/ML IV BOLUS
INTRAVENOUS | Status: DC | PRN
  Administered 2019-12-15: 200 mg via INTRAVENOUS

## 2019-12-15 MED ORDER — OXYCODONE HCL 5 MG PO TABS: 5.0000 mg | ORAL_TABLET | Freq: Once | ORAL | Status: DC | PRN

## 2019-12-15 MED ORDER — DEXAMETHASONE SODIUM PHOSPHATE 10 MG/ML IJ SOLN
INTRAMUSCULAR | Status: DC | PRN
  Administered 2019-12-15: 5 mg via INTRAVENOUS

## 2019-12-15 MED ORDER — SODIUM CHLORIDE 0.9 % IR SOLN
Status: DC | PRN
  Administered 2019-12-15 (×2): 3000 mL

## 2019-12-15 MED ORDER — BUPIVACAINE-EPINEPHRINE 0.5% -1:200000 IJ SOLN
INTRAMUSCULAR | Status: AC
  Filled 2019-12-15: qty 1

## 2019-12-15 MED ORDER — CHLORHEXIDINE GLUCONATE 0.12 % MT SOLN
15.0000 mL | Freq: Once | OROMUCOSAL | Status: AC
  Filled 2019-12-15 (×3): qty 15

## 2019-12-15 MED ORDER — LIDOCAINE 2% (20 MG/ML) 5 ML SYRINGE
INTRAMUSCULAR | Status: AC
  Filled 2019-12-15: qty 5

## 2019-12-15 MED ORDER — FENTANYL CITRATE (PF) 100 MCG/2ML IJ SOLN: 50.0000 ug | Freq: Once | INTRAMUSCULAR | Status: AC

## 2019-12-15 MED ORDER — FENTANYL CITRATE (PF) 100 MCG/2ML IJ SOLN
25.0000 ug | INTRAMUSCULAR | Status: DC | PRN
  Administered 2019-12-15 (×2): 50 ug via INTRAVENOUS

## 2019-12-15 MED ORDER — ONDANSETRON HCL 4 MG/2ML IJ SOLN
INTRAMUSCULAR | Status: DC | PRN
  Administered 2019-12-15: 4 mg via INTRAVENOUS

## 2019-12-15 MED ORDER — BUPIVACAINE HCL (PF) 0.25 % IJ SOLN
INTRAMUSCULAR | Status: AC
  Filled 2019-12-15: qty 30

## 2019-12-15 MED ORDER — PHENYLEPHRINE 40 MCG/ML (10ML) SYRINGE FOR IV PUSH (FOR BLOOD PRESSURE SUPPORT)
PREFILLED_SYRINGE | INTRAVENOUS | Status: DC | PRN
  Administered 2019-12-15 (×2): 120 ug via INTRAVENOUS

## 2019-12-15 MED ORDER — FENTANYL CITRATE (PF) 250 MCG/5ML IJ SOLN
INTRAMUSCULAR | Status: AC
  Filled 2019-12-15: qty 5

## 2019-12-15 MED ORDER — SUGAMMADEX SODIUM 200 MG/2ML IV SOLN
INTRAVENOUS | Status: DC | PRN
  Administered 2019-12-15: 150 mg via INTRAVENOUS

## 2019-12-15 MED ORDER — PROMETHAZINE HCL 25 MG/ML IJ SOLN: 6.2500 mg | INTRAMUSCULAR | Status: DC | PRN

## 2019-12-15 MED ORDER — PROPOFOL 10 MG/ML IV BOLUS
INTRAVENOUS | Status: AC
  Filled 2019-12-15: qty 20

## 2019-12-15 MED ORDER — ONDANSETRON HCL 4 MG/2ML IJ SOLN
INTRAMUSCULAR | Status: AC
  Filled 2019-12-15: qty 2

## 2019-12-15 MED ORDER — OXYCODONE HCL 5 MG/5ML PO SOLN: 5.0000 mg | Freq: Once | ORAL | Status: DC | PRN

## 2019-12-15 MED ORDER — FENTANYL CITRATE (PF) 100 MCG/2ML IJ SOLN
INTRAMUSCULAR | Status: AC
  Administered 2019-12-15: 50 ug via INTRAVENOUS
  Filled 2019-12-15: qty 2

## 2019-12-15 MED ORDER — SUCCINYLCHOLINE CHLORIDE 200 MG/10ML IV SOSY
PREFILLED_SYRINGE | INTRAVENOUS | Status: AC
  Filled 2019-12-15: qty 10

## 2019-12-15 MED ORDER — ROCURONIUM BROMIDE 10 MG/ML (PF) SYRINGE
PREFILLED_SYRINGE | INTRAVENOUS | Status: AC
  Filled 2019-12-15: qty 20

## 2019-12-15 MED ORDER — ROCURONIUM BROMIDE 10 MG/ML (PF) SYRINGE
PREFILLED_SYRINGE | INTRAVENOUS | Status: DC | PRN
  Administered 2019-12-15: 30 mg via INTRAVENOUS
  Administered 2019-12-15: 10 mg via INTRAVENOUS

## 2019-12-15 MED ORDER — CEFAZOLIN SODIUM-DEXTROSE 2-3 GM-%(50ML) IV SOLR
INTRAVENOUS | Status: DC | PRN
  Administered 2019-12-15: 2 g via INTRAVENOUS

## 2019-12-15 MED ORDER — CEFAZOLIN SODIUM-DEXTROSE 2-4 GM/100ML-% IV SOLN
INTRAVENOUS | Status: AC
  Filled 2019-12-15: qty 100

## 2019-12-15 MED ORDER — MIDAZOLAM HCL 2 MG/2ML IJ SOLN
INTRAMUSCULAR | Status: AC
  Filled 2019-12-15: qty 2

## 2019-12-15 MED ORDER — PHENYLEPHRINE 40 MCG/ML (10ML) SYRINGE FOR IV PUSH (FOR BLOOD PRESSURE SUPPORT)
PREFILLED_SYRINGE | INTRAVENOUS | Status: AC
  Filled 2019-12-15: qty 30

## 2019-12-15 MED ORDER — HYDROMORPHONE HCL 1 MG/ML IJ SOLN
INTRAMUSCULAR | Status: AC
  Filled 2019-12-15: qty 0.5

## 2019-12-15 MED ORDER — LACTATED RINGERS IV SOLN: INTRAVENOUS | Status: DC | PRN

## 2019-12-15 MED ORDER — FENTANYL CITRATE (PF) 100 MCG/2ML IJ SOLN
INTRAMUSCULAR | Status: AC
  Filled 2019-12-15: qty 2

## 2019-12-15 MED ORDER — LIDOCAINE HCL 2 % IJ SOLN
INTRAMUSCULAR | Status: AC
  Filled 2019-12-15: qty 20

## 2019-12-15 MED ORDER — DEXAMETHASONE SODIUM PHOSPHATE 10 MG/ML IJ SOLN
INTRAMUSCULAR | Status: AC
  Filled 2019-12-15: qty 2

## 2019-12-15 MED ORDER — SUCCINYLCHOLINE CHLORIDE 200 MG/10ML IV SOSY
PREFILLED_SYRINGE | INTRAVENOUS | Status: DC | PRN
  Administered 2019-12-15: 120 mg via INTRAVENOUS

## 2019-12-15 SURGICAL SUPPLY — 67 items
BAR CARBON EXFX 6X200 (EXFIX) ×3 IMPLANT
BNDG COHESIVE 1X5 TAN STRL LF (GAUZE/BANDAGES/DRESSINGS) IMPLANT
BNDG COHESIVE 2X5 TAN STRL LF (GAUZE/BANDAGES/DRESSINGS) ×3 IMPLANT
BNDG COHESIVE 4X5 TAN STRL (GAUZE/BANDAGES/DRESSINGS) ×3 IMPLANT
BNDG CONFORM 2 STRL LF (GAUZE/BANDAGES/DRESSINGS) IMPLANT
BNDG ESMARK 4X9 LF (GAUZE/BANDAGES/DRESSINGS) ×3 IMPLANT
BNDG GAUZE ELAST 4 BULKY (GAUZE/BANDAGES/DRESSINGS) ×27 IMPLANT
BRUSH SCRUB EZ  4% CHG (MISCELLANEOUS) ×4
BRUSH SCRUB EZ 4% CHG (MISCELLANEOUS) ×8 IMPLANT
CORD BIPOLAR FORCEPS 12FT (ELECTRODE) ×3 IMPLANT
COVER SURGICAL LIGHT HANDLE (MISCELLANEOUS) ×3 IMPLANT
CUFF TOURN SGL QUICK 18X4 (TOURNIQUET CUFF) ×3 IMPLANT
DEPRESSOR TONGUE BLADE STERILE (MISCELLANEOUS) ×3 IMPLANT
DRAPE C-ARM 42X72 X-RAY (DRAPES) ×3 IMPLANT
DRAPE HALF SHEET 40X57 (DRAPES) ×3 IMPLANT
DRAPE LAPAROSCOPIC ABDOMINAL (DRAPES) IMPLANT
DRAPE LAPAROTOMY 100X72 PEDS (DRAPES) IMPLANT
DRAPE SURG 17X23 STRL (DRAPES) ×3 IMPLANT
DRSG EMULSION OIL 3X3 NADH (GAUZE/BANDAGES/DRESSINGS) IMPLANT
DRSG PAD ABDOMINAL 8X10 ST (GAUZE/BANDAGES/DRESSINGS) ×12 IMPLANT
ELECT REM PT RETURN 9FT ADLT (ELECTROSURGICAL) ×3
ELECTRODE REM PT RTRN 9FT ADLT (ELECTROSURGICAL) ×2 IMPLANT
GAUZE SPONGE 4X4 12PLY STRL (GAUZE/BANDAGES/DRESSINGS) IMPLANT
GAUZE XEROFORM 5X9 LF (GAUZE/BANDAGES/DRESSINGS) ×54 IMPLANT
GLOVE BIO SURGEON STRL SZ 6.5 (GLOVE) ×6 IMPLANT
GLOVE BIO SURGEON STRL SZ7.5 (GLOVE) ×15 IMPLANT
GLOVE BIOGEL PI IND STRL 7.0 (GLOVE) ×2 IMPLANT
GLOVE BIOGEL PI IND STRL 8 (GLOVE) ×8 IMPLANT
GLOVE BIOGEL PI INDICATOR 7.0 (GLOVE) ×1
GLOVE BIOGEL PI INDICATOR 8 (GLOVE) ×4
GOWN STRL REUS W/ TWL LRG LVL3 (GOWN DISPOSABLE) ×6 IMPLANT
GOWN STRL REUS W/ TWL XL LVL3 (GOWN DISPOSABLE) ×2 IMPLANT
GOWN STRL REUS W/TWL LRG LVL3 (GOWN DISPOSABLE) ×3
GOWN STRL REUS W/TWL XL LVL3 (GOWN DISPOSABLE) ×1
GUIDEWIRE ORTH 1.1XCAN SCR SYS (WIRE) ×8 IMPLANT
K-WIRE 1.1 (WIRE) ×4
K-WIRE DBL TROCAR .045X4 (WIRE) ×12
K-WIRE SURGICAL 1.6X102 (WIRE) ×3 IMPLANT
KIT BASIN (CUSTOM PROCEDURE TRAY) ×3 IMPLANT
KIT BASIN OR (CUSTOM PROCEDURE TRAY) ×3 IMPLANT
KIT TURNOVER KIT B (KITS) ×6 IMPLANT
KWIRE ×3 IMPLANT
KWIRE 0.045" ×12 IMPLANT
KWIRE DBL TROCAR .045X4 (WIRE) ×8 IMPLANT
NEEDLE HYPO 25X1 1.5 SAFETY (NEEDLE) ×3 IMPLANT
NS IRRIG 1000ML POUR BTL (IV SOLUTION) ×6 IMPLANT
PACK GENERAL/GYN (CUSTOM PROCEDURE TRAY) ×3 IMPLANT
PACK ORTHO EXTREMITY (CUSTOM PROCEDURE TRAY) ×3 IMPLANT
PAD ARMBOARD 7.5X6 YLW CONV (MISCELLANEOUS) ×3 IMPLANT
PAD CAST 4YDX4 CTTN HI CHSV (CAST SUPPLIES) IMPLANT
PADDING CAST COTTON 4X4 STRL (CAST SUPPLIES)
PADDING UNDERCAST 2 STRL (CAST SUPPLIES) ×1
PADDING UNDERCAST 2X4 STRL (CAST SUPPLIES) ×2 IMPLANT
PIN CLAMP 1BAR 33MM EXFIX (EXFIX) ×6 IMPLANT
PIN HALF 2.5MM 120X25MM EXFIX (EXFIX) ×6 IMPLANT
PIN HALF 3.0MM 120X25MM EXFIX (EXFIX) ×6 IMPLANT
PULSAVAC PLUS IRRIG FAN TIP (DISPOSABLE) ×3
SPONGE LAP 18X18 RF (DISPOSABLE) ×3 IMPLANT
SUT CHROMIC 6 0 PS 4 (SUTURE) IMPLANT
SUT ETHILON 4 0 PS 2 18 (SUTURE) IMPLANT
SUT VICRYL RAPIDE 4/0 PS 2 (SUTURE) ×6 IMPLANT
SYR 10ML LL (SYRINGE) IMPLANT
TIP FAN IRRIG PULSAVAC PLUS (DISPOSABLE) ×2 IMPLANT
TOWEL GREEN STERILE (TOWEL DISPOSABLE) ×6 IMPLANT
TOWEL GREEN STERILE FF (TOWEL DISPOSABLE) ×6 IMPLANT
TUBE CONNECTING 12X1/4 (SUCTIONS) ×3 IMPLANT
UNDERPAD 30X36 HEAVY ABSORB (UNDERPADS AND DIAPERS) ×3 IMPLANT

## 2019-12-15 NOTE — Transfer of Care (Signed)
Immediate Anesthesia Transfer of Care Note  Patient: Ory T Brager  Procedure(s) Performed: REDUCTION AND EXTERNAL FIXATION RIGHT WRIST (Right Wrist) IRRIGATION AND DEBRIDEMENT OF BILATERAL LOWER EXTREMITIES, BILATERAL UPPER EXTREMITIES,  AND ABDOMINAL WALL (N/A )  Patient Location: PACU  Anesthesia Type:General  Level of Consciousness: awake, alert , oriented and patient cooperative  Airway & Oxygen Therapy: Patient Spontanous Breathing  Post-op Assessment: Report given to RN and Post -op Vital signs reviewed and stable  Post vital signs: Reviewed and stable  Last Vitals:  Vitals Value Taken Time  BP    Temp    Pulse 147 12/15/19 1815  Resp 16 12/15/19 1815  SpO2 96 % 12/15/19 1815  Vitals shown include unvalidated device data.  Last Pain:  Vitals:   12/15/19 1346  TempSrc: Oral  PainSc:          Complications: No complications documented.

## 2019-12-15 NOTE — Progress Notes (Signed)
Rehab Admissions Coordinator Note:  Patient was screened by Clois Dupes for appropriateness for an Inpatient Acute Rehab Consult per therapy recs. .  At this time, we are recommending Inpatient Rehab consult. I will place order per protocol.  Clois Dupes RN MSN 12/15/2019, 11:56 AM  I can be reached at [phone].
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
## 2019-12-15 NOTE — Anesthesia Procedure Notes (Addendum)
Procedure Name: Intubation Date/Time: 12/15/2019 3:18 PM Performed by: Moshe Salisbury, CRNA Pre-anesthesia Checklist: Patient identified, Emergency Drugs available, Suction available and Patient being monitored Patient Re-evaluated:Patient Re-evaluated prior to induction Oxygen Delivery Method: Circle System Utilized Preoxygenation: Pre-oxygenation with 100% oxygen Induction Type: IV induction Ventilation: Mask ventilation without difficulty Laryngoscope Size: Mac and 4 Grade View: Grade I Tube type: Oral Tube size: 7.5 mm Number of attempts: 1 Airway Equipment and Method: Stylet Placement Confirmation: ETT inserted through vocal cords under direct vision,  positive ETCO2 and breath sounds checked- equal and bilateral Secured at: 24 cm Tube secured with: Tape Dental Injury: Teeth and Oropharynx as per pre-operative assessment
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
## 2019-12-15 NOTE — Progress Notes (Signed)
SLP Cancellation Note  Patient Details Name: Shawn Strickland MRN: [id number] DOB: 1995-12-13   Cancelled treatment:       Reason Eval/Treat Not Completed: Other (comment). Orders received for SLE. Pt recently given dilaudid for pain, and will be going to OR soon. Will defer SLE until after surgery.  Aleta Manternach B. Murvin Natal, [facility], CCC-SLP Speech Language Pathologist Office: [phone] Pager: [phone]  Shawn Strickland 12/15/2019, 10:06 AM

## 2019-12-16 ENCOUNTER — Encounter (HOSPITAL_COMMUNITY): Payer: Self-pay | Admitting: Orthopedic Surgery

## 2019-12-16 LAB — CBC
HCT: 37.7 % — ABNORMAL LOW (ref 39.0–52.0)
Hemoglobin: 12.8 g/dL — ABNORMAL LOW (ref 13.0–17.0)
MCH: 30.8 pg (ref 26.0–34.0)
MCHC: 34 g/dL (ref 30.0–36.0)
MCV: 90.6 fL (ref 80.0–100.0)
Platelets: 217 10*3/uL (ref 150–400)
RBC: 4.16 MIL/uL — ABNORMAL LOW (ref 4.22–5.81)
RDW: 12.7 % (ref 11.5–15.5)
WBC: 5 10*3/uL (ref 4.0–10.5)
nRBC: 0 % (ref 0.0–0.2)

## 2019-12-16 LAB — BASIC METABOLIC PANEL
Anion gap: 8 (ref 5–15)
BUN: 6 mg/dL (ref 6–20)
CO2: 24 mmol/L (ref 22–32)
Calcium: 8.4 mg/dL — ABNORMAL LOW (ref 8.9–10.3)
Chloride: 102 mmol/L (ref 98–111)
Creatinine, Ser: 0.84 mg/dL (ref 0.61–1.24)
GFR calc Af Amer: >60 mL/min (ref >60)
GFR calc non Af Amer: >60 mL/min (ref >60)
Glucose, Bld: 129 mg/dL — ABNORMAL HIGH (ref 70–99)
Potassium: 4.1 mmol/L (ref 3.5–5.1)
Sodium: 134 mmol/L — ABNORMAL LOW (ref 135–145)

## 2019-12-16 MED ORDER — SILVER SULFADIAZINE 1 % EX CREA
TOPICAL_CREAM | Freq: Two times a day (BID) | CUTANEOUS | Status: DC
  Administered 2019-12-16: 1 via TOPICAL
  Filled 2019-12-16: qty 400
  Filled 2019-12-16 (×4): qty 85

## 2019-12-16 NOTE — Progress Notes (Addendum)
  PATIENT ID: Shawn Strickland  MRN: [id number]  DOB/AGE:  07/05/1995 / 24 y.o.  12-15-19:  R wrist Ex fix and ORIF wrist fx/dx  Subjective: Pain reasonably controlled, mom with patient at bedside   Objective: R UE bandages with serous drainage, serosanguinous at ex fix pin sites INtact but greatly diminished LT sens R/M/U.  Can flex/ext digits and thumb, but digital abduction still suspect.  Compartments soft  Assessment/Plan: 1. Pin site care can begin (no peroxide), but leaving the coban dressing intact over the percutaneous kwires to prevent dislodging them. 2. Begin aggressive DAILY formal therapy for digital ROM and Prono/supination, complemented by exercises performed on his own.  No restrictions to digital ROM or Prono/supination 3. F/u with me in 10-15 days in outpatient setting.  Cliffton Asters Janee Morn, MD      [facility]opaedic [facility] [address] [place], [place]  [address] Office: [phone] Mobile: [phone]  12/16/2019, 7:50 AM
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
## 2019-12-16 NOTE — Plan of Care (Signed)
  Problem: Education: Goal: Knowledge of General Education information will improve Description: Including pain rating scale, medication(s)/side effects and non-pharmacologic comfort measures Outcome: Progressing   Problem: Nutrition: Goal: Adequate nutrition will be maintained Outcome: Progressing   
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
## 2019-12-17 ENCOUNTER — Encounter (HOSPITAL_COMMUNITY): Payer: Self-pay

## 2019-12-17 LAB — CBC
HCT: 34.1 % — ABNORMAL LOW (ref 39.0–52.0)
Hemoglobin: 11.1 g/dL — ABNORMAL LOW (ref 13.0–17.0)
MCH: 29.6 pg (ref 26.0–34.0)
MCHC: 32.6 g/dL (ref 30.0–36.0)
MCV: 90.9 fL (ref 80.0–100.0)
Platelets: 223 10*3/uL (ref 150–400)
RBC: 3.75 MIL/uL — ABNORMAL LOW (ref 4.22–5.81)
RDW: 12.9 % (ref 11.5–15.5)
WBC: 3 10*3/uL — ABNORMAL LOW (ref 4.0–10.5)
nRBC: 0 % (ref 0.0–0.2)

## 2019-12-17 MED ORDER — DOCUSATE SODIUM 100 MG PO CAPS
100.0000 mg | ORAL_CAPSULE | Freq: Two times a day (BID) | ORAL | Status: DC
  Administered 2019-12-17 – 2019-12-25 (×16): 100 mg via ORAL
  Filled 2019-12-17 (×18): qty 1

## 2019-12-17 MED ORDER — BOOST / RESOURCE BREEZE PO LIQD CUSTOM
1.0000 | Freq: Three times a day (TID) | ORAL | Status: DC
  Administered 2019-12-17 – 2019-12-21 (×12): 1 via ORAL

## 2019-12-17 MED ORDER — SODIUM CHLORIDE 0.9 % IV SOLN: 250.0000 mL | INTRAVENOUS | Status: DC | PRN

## 2019-12-17 MED ORDER — POLYETHYLENE GLYCOL 3350 17 G PO PACK
17.0000 g | PACK | Freq: Every day | ORAL | Status: DC
  Administered 2019-12-17 – 2019-12-25 (×8): 17 g via ORAL
  Filled 2019-12-17 (×10): qty 1

## 2019-12-17 MED ORDER — SODIUM CHLORIDE 0.9% FLUSH
3.0000 mL | INTRAVENOUS | Status: DC | PRN
  Administered 2019-12-23: 3 mL via INTRAVENOUS

## 2019-12-17 MED ORDER — SODIUM CHLORIDE 0.9% FLUSH
3.0000 mL | Freq: Two times a day (BID) | INTRAVENOUS | Status: DC
  Administered 2019-12-17 – 2019-12-23 (×10): 3 mL via INTRAVENOUS

## 2019-12-17 NOTE — Anesthesia Postprocedure Evaluation (Signed)
Anesthesia Post Note  Patient: Damonte T Beretta  Procedure(s) Performed: REDUCTION AND EXTERNAL FIXATION RIGHT WRIST (Right Wrist) IRRIGATION AND DEBRIDEMENT OF BILATERAL LOWER EXTREMITIES, BILATERAL UPPER EXTREMITIES,  AND ABDOMINAL WALL (N/A )     Patient location during evaluation: PACU Anesthesia Type: General Level of consciousness: awake and alert Pain management: pain level controlled Vital Signs Assessment: post-procedure vital signs reviewed and stable Respiratory status: spontaneous breathing, nonlabored ventilation, respiratory function stable and patient connected to nasal cannula oxygen Cardiovascular status: blood pressure returned to baseline and stable Postop Assessment: no apparent nausea or vomiting Anesthetic complications: no   No complications documented.  Last Vitals:  Vitals:   12/16/19 2102 12/17/19 0402  BP:  130/63  Pulse: (!) 109 93  Resp:  18  Temp:  36.6 C  SpO2: 98% 98%    Last Pain:  Vitals:   12/17/19 1400  TempSrc:   PainSc: 8                  Kennieth Rad
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
## 2019-12-17 NOTE — TOC CAGE-AID Note (Signed)
Transition of Care [facility]) - CAGE-AID Screening   Patient Details  Name: Shawn Strickland MRN: [id number] Date of Birth: 1996-02-18  Transition of Care [facility]) CM/SW Contact:    Emeterio Reeve, [place] Phone Number: 12/17/2019, 12:08 PM   Clinical Narrative:  CSW met with pt at bedside. CSW introduced self and explained her role at the hospital.  Pt reports social alcohol use with friends. Pt denies substance use. Pt did not need any resources at this time.    CAGE-AID Screening:    Have You Ever Felt You Ought to Cut Down on Your Drinking or Drug Use?: No Have People Annoyed You By Critizing Your Drinking Or Drug Use?: No Have You Felt Bad Or Guilty About Your Drinking Or Drug Use?: No Have You Ever Had a Drink or Used Drugs First Thing In The Morning to Steady Your Nerves or to Get Rid of a Hangover?: No CAGE-AID Score: 0  Substance Abuse Education Offered: Yes      Blima Ledger, [place] Social Worker [phone]

## 2019-12-18 MED ORDER — METHOCARBAMOL 500 MG PO TABS
1000.0000 mg | ORAL_TABLET | Freq: Three times a day (TID) | ORAL | Status: DC | PRN
  Administered 2019-12-18 – 2019-12-25 (×11): 1000 mg via ORAL
  Filled 2019-12-18 (×12): qty 2

## 2019-12-18 MED ORDER — OXYCODONE HCL 5 MG PO TABS
5.0000 mg | ORAL_TABLET | ORAL | Status: DC | PRN
  Administered 2019-12-18 – 2019-12-21 (×9): 15 mg via ORAL
  Administered 2019-12-21: 10 mg via ORAL
  Filled 2019-12-18: qty 3
  Filled 2019-12-18: qty 2
  Filled 2019-12-18 (×4): qty 3
  Filled 2019-12-18: qty 2
  Filled 2019-12-18 (×4): qty 3

## 2019-12-18 NOTE — Progress Notes (Signed)
   12/18/19 2312  Assess: MEWS Score  Temp (!) 100.9 F (38.3 C)  BP (!) 148/64  Pulse Rate (!) 106  Resp 18  SpO2 98 %  O2 Device Room Air  Assess: MEWS Score  MEWS Temp 1  MEWS Systolic 0  MEWS Pulse 1  MEWS RR 0  MEWS LOC 0  MEWS Score 2  MEWS Score Color Yellow  Assess: if the MEWS score is Yellow or Red  Were vital signs taken at a resting state? Yes  Focused Assessment Change from prior assessment (see assessment flowsheet)  Early Detection of Sepsis Score *See Row Information* Low  MEWS guidelines implemented *See Row Information* Yes  Treat  MEWS Interventions Administered scheduled meds/treatments  Pain Scale 0-10  Pain Score 6  Pain Type Acute pain;Surgical pain  Pain Location Generalized  Pain Descriptors / Indicators Aching;Burning;Discomfort  Pain Frequency Constant  Pain Onset On-going  Patients Stated Pain Goal 0  Pain Intervention(s) Medication (See eMAR)  Multiple Pain Sites Yes  Take Vital Signs  Increase Vital Sign Frequency  Yellow: Q 2hr X 2 then Q 4hr X 2, if remains yellow, continue Q 4hrs  Escalate  MEWS: Escalate Yellow: discuss with charge nurse/RN and consider discussing with provider and RRT  Notify: Charge Nurse/RN  Name of Charge Nurse/RN Notified Josephine, RN  Date Charge Nurse/RN Notified 12/18/19  Time Charge Nurse/RN Notified 2312  Document  Progress note created (see row info) Yes

## 2019-12-18 NOTE — Progress Notes (Signed)
PT Cancellation Note  Patient Details Name: Shawn Strickland MRN: [id number] DOB: 02-18-1996   Cancelled Treatment:    Reason Eval/Treat Not Completed: Other (comment).  Made two attempts to see pt and he is unavailable.  Lengthy dressing process for wounds.  May re-attempt as time and pt allow.   Ivar Drape 12/18/2019, 12:17 PM   Samul Dada, PT MS Acute Rehab Dept. Number: [facility] [id number] and [facility] [phone]
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
## 2019-12-19 ENCOUNTER — Inpatient Hospital Stay (HOSPITAL_COMMUNITY): Payer: BC Managed Care – PPO | Admitting: Anesthesiology

## 2019-12-19 ENCOUNTER — Encounter (HOSPITAL_COMMUNITY): Admission: EM | Disposition: A | Discharge status: Home / Self Care | Payer: Self-pay | Source: Home / Self Care

## 2019-12-19 DIAGNOSIS — S91102A Unspecified open wound of left great toe without damage to nail, initial encounter: Secondary | ICD-10-CM

## 2019-12-19 DIAGNOSIS — S41102A Unspecified open wound of left upper arm, initial encounter: Secondary | ICD-10-CM

## 2019-12-19 DIAGNOSIS — S91105A Unspecified open wound of left lesser toe(s) without damage to nail, initial encounter: Secondary | ICD-10-CM

## 2019-12-19 DIAGNOSIS — S71101A Unspecified open wound, right thigh, initial encounter: Secondary | ICD-10-CM

## 2019-12-19 DIAGNOSIS — S71102A Unspecified open wound, left thigh, initial encounter: Secondary | ICD-10-CM

## 2019-12-19 DIAGNOSIS — S41101A Unspecified open wound of right upper arm, initial encounter: Secondary | ICD-10-CM

## 2019-12-19 DIAGNOSIS — S0081XA Abrasion of other part of head, initial encounter: Secondary | ICD-10-CM

## 2019-12-19 DIAGNOSIS — S31109A Unspecified open wound of abdominal wall, unspecified quadrant without penetration into peritoneal cavity, initial encounter: Secondary | ICD-10-CM

## 2019-12-19 HISTORY — PX: APPLICATION OF A-CELL OF EXTREMITY: SHX6303

## 2019-12-19 HISTORY — PX: INCISION AND DRAINAGE OF WOUND: SHX1803

## 2019-12-19 HISTORY — PX: I & D EXTREMITY: SHX5045

## 2019-12-19 SURGERY — IRRIGATION AND DEBRIDEMENT WOUND
Anesthesia: General | Site: Leg Upper

## 2019-12-19 MED ORDER — KETOROLAC TROMETHAMINE 30 MG/ML IJ SOLN
30.0000 mg | Freq: Once | INTRAMUSCULAR | Status: AC | PRN
  Administered 2019-12-19: 30 mg via INTRAVENOUS

## 2019-12-19 MED ORDER — SODIUM BICARBONATE 4 % IV SOLN: Freq: Once | INTRAVENOUS | Status: DC

## 2019-12-19 MED ORDER — HYDROMORPHONE HCL 1 MG/ML IJ SOLN
INTRAMUSCULAR | Status: AC
Start: 2019-12-19 — End: 2019-12-20
  Filled 2019-12-19: qty 1

## 2019-12-19 MED ORDER — HYDROMORPHONE HCL 1 MG/ML IJ SOLN
INTRAMUSCULAR | Status: DC | PRN
  Administered 2019-12-19: .5 mg via INTRAVENOUS

## 2019-12-19 MED ORDER — BISACODYL 10 MG RE SUPP
10.0000 mg | Freq: Every day | RECTAL | Status: DC | PRN
  Administered 2019-12-21: 10 mg via RECTAL
  Filled 2019-12-19: qty 1

## 2019-12-19 MED ORDER — PROPOFOL 10 MG/ML IV BOLUS
INTRAVENOUS | Status: AC
  Filled 2019-12-19: qty 20

## 2019-12-19 MED ORDER — OXYCODONE HCL 5 MG PO TABS: 5.0000 mg | ORAL_TABLET | Freq: Once | ORAL | Status: DC | PRN

## 2019-12-19 MED ORDER — PHENYLEPHRINE HCL (PRESSORS) 10 MG/ML IV SOLN
INTRAVENOUS | Status: DC | PRN
  Administered 2019-12-19: 80 ug via INTRAVENOUS

## 2019-12-19 MED ORDER — MIDAZOLAM HCL 5 MG/5ML IJ SOLN
INTRAMUSCULAR | Status: DC | PRN
  Administered 2019-12-19 (×2): 2 mg via INTRAVENOUS

## 2019-12-19 MED ORDER — SODIUM BICARBONATE 4 % IV SOLN
INTRAVENOUS | Status: DC | PRN
  Administered 2019-12-19: 500 mL via INTRAMUSCULAR

## 2019-12-19 MED ORDER — HYDROMORPHONE HCL 1 MG/ML IJ SOLN
INTRAMUSCULAR | Status: AC
  Filled 2019-12-19: qty 0.5

## 2019-12-19 MED ORDER — MIDAZOLAM HCL 2 MG/2ML IJ SOLN
INTRAMUSCULAR | Status: AC
  Filled 2019-12-19: qty 2

## 2019-12-19 MED ORDER — SODIUM CHLORIDE 0.9 % IR SOLN
Status: DC | PRN
  Administered 2019-12-19: 3000 mL

## 2019-12-19 MED ORDER — FENTANYL CITRATE (PF) 250 MCG/5ML IJ SOLN
INTRAMUSCULAR | Status: AC
  Filled 2019-12-19: qty 5

## 2019-12-19 MED ORDER — SODIUM BICARBONATE 4 % IV SOLN
INTRAVENOUS | Status: DC
  Filled 2019-12-19: qty 50

## 2019-12-19 MED ORDER — CEFAZOLIN SODIUM-DEXTROSE 2-4 GM/100ML-% IV SOLN
2.0000 g | INTRAVENOUS | Status: AC
  Administered 2019-12-19: 2 g via INTRAVENOUS
  Filled 2019-12-19: qty 100

## 2019-12-19 MED ORDER — BACITRACIN ZINC 500 UNIT/GM EX OINT
TOPICAL_OINTMENT | CUTANEOUS | Status: AC
  Filled 2019-12-19: qty 28.35

## 2019-12-19 MED ORDER — OXYCODONE HCL 5 MG/5ML PO SOLN: 5.0000 mg | Freq: Once | ORAL | Status: DC | PRN

## 2019-12-19 MED ORDER — PROMETHAZINE HCL 25 MG/ML IJ SOLN
6.2500 mg | INTRAMUSCULAR | Status: DC | PRN
  Administered 2019-12-19: 12.5 mg via INTRAVENOUS

## 2019-12-19 MED ORDER — DEXAMETHASONE SODIUM PHOSPHATE 10 MG/ML IJ SOLN
INTRAMUSCULAR | Status: DC | PRN
  Administered 2019-12-19: 5 mg via INTRAVENOUS

## 2019-12-19 MED ORDER — PROPOFOL 10 MG/ML IV BOLUS
INTRAVENOUS | Status: DC | PRN
  Administered 2019-12-19: 200 mg via INTRAVENOUS

## 2019-12-19 MED ORDER — ACETAMINOPHEN 500 MG PO TABS
1000.0000 mg | ORAL_TABLET | Freq: Once | ORAL | Status: AC
  Administered 2019-12-19: 1000 mg via ORAL
  Filled 2019-12-19: qty 2

## 2019-12-19 MED ORDER — ONDANSETRON HCL 4 MG/2ML IJ SOLN
INTRAMUSCULAR | Status: AC
  Filled 2019-12-19: qty 2

## 2019-12-19 MED ORDER — LACTATED RINGERS IV SOLN: INTRAVENOUS | Status: DC

## 2019-12-19 MED ORDER — FENTANYL CITRATE (PF) 250 MCG/5ML IJ SOLN
INTRAMUSCULAR | Status: DC | PRN
Start: 2019-12-19 — End: 2019-12-19
  Administered 2019-12-19 (×5): 50 ug via INTRAVENOUS

## 2019-12-19 MED ORDER — HYDROMORPHONE HCL 1 MG/ML IJ SOLN
0.2500 mg | INTRAMUSCULAR | Status: DC | PRN
  Administered 2019-12-19 (×3): 0.5 mg via INTRAVENOUS

## 2019-12-19 MED ORDER — KETOROLAC TROMETHAMINE 30 MG/ML IJ SOLN
INTRAMUSCULAR | Status: AC
  Filled 2019-12-19: qty 1

## 2019-12-19 MED ORDER — PROMETHAZINE HCL 25 MG/ML IJ SOLN
INTRAMUSCULAR | Status: AC
  Filled 2019-12-19: qty 1

## 2019-12-19 MED ORDER — MAGNESIUM CITRATE PO SOLN
0.5000 | Freq: Once | ORAL | Status: AC | PRN
  Administered 2019-12-21: 1 via ORAL
  Filled 2019-12-19: qty 296

## 2019-12-19 MED ORDER — CHLORHEXIDINE GLUCONATE 0.12 % MT SOLN
OROMUCOSAL | Status: AC
  Administered 2019-12-19: 15 mL via OROMUCOSAL
  Filled 2019-12-19: qty 15

## 2019-12-19 MED ORDER — LIDOCAINE 2% (20 MG/ML) 5 ML SYRINGE
INTRAMUSCULAR | Status: DC | PRN
  Administered 2019-12-19: 60 mg via INTRAVENOUS

## 2019-12-19 MED ORDER — DEXMEDETOMIDINE (PRECEDEX) IN NS 20 MCG/5ML (4 MCG/ML) IV SYRINGE
PREFILLED_SYRINGE | INTRAVENOUS | Status: DC | PRN
  Administered 2019-12-19 (×2): 8 ug via INTRAVENOUS
  Administered 2019-12-19: 4 ug via INTRAVENOUS

## 2019-12-19 MED ORDER — DEXMEDETOMIDINE (PRECEDEX) IN NS 20 MCG/5ML (4 MCG/ML) IV SYRINGE
PREFILLED_SYRINGE | INTRAVENOUS | Status: AC
  Filled 2019-12-19: qty 5

## 2019-12-19 MED ORDER — 0.9 % SODIUM CHLORIDE (POUR BTL) OPTIME
TOPICAL | Status: DC | PRN
  Administered 2019-12-19 (×3): 1000 mL

## 2019-12-19 MED ORDER — LIDOCAINE 2% (20 MG/ML) 5 ML SYRINGE
INTRAMUSCULAR | Status: AC
  Filled 2019-12-19: qty 5

## 2019-12-19 MED ORDER — DEXAMETHASONE SODIUM PHOSPHATE 10 MG/ML IJ SOLN
INTRAMUSCULAR | Status: AC
  Filled 2019-12-19: qty 1

## 2019-12-19 SURGICAL SUPPLY — 47 items
BAG DECANTER FOR FLEXI CONT (MISCELLANEOUS) ×4 IMPLANT
BLADE SURG 15 STRL LF DISP TIS (BLADE) ×3 IMPLANT
BLADE SURG 15 STRL SS (BLADE) ×4
BNDG COHESIVE 4X5 TAN STRL (GAUZE/BANDAGES/DRESSINGS) ×4 IMPLANT
BNDG ELASTIC 2 VLCR STRL LF (GAUZE/BANDAGES/DRESSINGS) ×4 IMPLANT
BNDG ELASTIC 4X5.8 VLCR STR LF (GAUZE/BANDAGES/DRESSINGS) ×8 IMPLANT
BNDG ELASTIC 6X10 VLCR STRL LF (GAUZE/BANDAGES/DRESSINGS) ×8 IMPLANT
BNDG GAUZE ELAST 4 BULKY (GAUZE/BANDAGES/DRESSINGS) ×8 IMPLANT
CANISTER SUCT 1200ML W/VALVE (MISCELLANEOUS) ×4 IMPLANT
COVER SURGICAL LIGHT HANDLE (MISCELLANEOUS) ×4 IMPLANT
DRAPE BILATERAL LIMB T (DRAPES) ×4 IMPLANT
DRAPE ORTHO SPLIT 77X108 STRL (DRAPES) ×12
DRAPE SURG ORHT 6 SPLT 77X108 (DRAPES) ×9 IMPLANT
DRSG MEPITEL 4X7.2 (GAUZE/BANDAGES/DRESSINGS) ×16 IMPLANT
DRSG MEPITEL 8X12 (GAUZE/BANDAGES/DRESSINGS) ×20 IMPLANT
DRSG TELFA 3X8 NADH (GAUZE/BANDAGES/DRESSINGS) ×68 IMPLANT
ELECT CAUTERY BLADE 6.4 (BLADE) ×4 IMPLANT
ELECT REM PT RETURN 9FT ADLT (ELECTROSURGICAL) ×4
ELECTRODE REM PT RTRN 9FT ADLT (ELECTROSURGICAL) ×3 IMPLANT
GAUZE SPONGE 4X4 12PLY STRL (GAUZE/BANDAGES/DRESSINGS) ×28 IMPLANT
GLOVE BIO SURGEON STRL SZ7 (GLOVE) ×8 IMPLANT
GLOVE BIOGEL PI IND STRL 8 (GLOVE) ×6 IMPLANT
GLOVE BIOGEL PI INDICATOR 8 (GLOVE) ×2
GOWN STRL REUS W/ TWL LRG LVL3 (GOWN DISPOSABLE) ×9 IMPLANT
GOWN STRL REUS W/TWL LRG LVL3 (GOWN DISPOSABLE) ×12
HANDPIECE VERSAJET 45 DEG 8 (MISCELLANEOUS) ×4 IMPLANT
KIT BASIN OR (CUSTOM PROCEDURE TRAY) ×8 IMPLANT
KIT TURNOVER KIT B (KITS) ×4 IMPLANT
MANIFOLD NEPTUNE II (INSTRUMENTS) ×4 IMPLANT
MARKER SKIN DUAL TIP RULER LAB (MISCELLANEOUS) ×4 IMPLANT
MATRIX SURG PERF 3LAYER 16X35 (Tissue) ×8 IMPLANT
MATRIX WOUND 2-LAYER 7X10CM (Tissue) ×8 IMPLANT
MATRIX WOUND MULITLAYER (Tissue) ×8 IMPLANT
NS IRRIG 1000ML POUR BTL (IV SOLUTION) ×4 IMPLANT
PACK ORTHO EXTREMITY (CUSTOM PROCEDURE TRAY) ×4 IMPLANT
PACK UNIVERSAL I (CUSTOM PROCEDURE TRAY) ×4 IMPLANT
PAD ABD 8X10 STRL (GAUZE/BANDAGES/DRESSINGS) ×12 IMPLANT
PAD ARMBOARD 7.5X6 YLW CONV (MISCELLANEOUS) ×4 IMPLANT
SPONGE LAP 18X18 RF (DISPOSABLE) ×20 IMPLANT
STOCKINETTE IMPERVIOUS LG (DRAPES) ×4 IMPLANT
SUT VIC AB 3-0 PS2 18 (SUTURE) ×120 IMPLANT
SYR BULB IRRIG 60ML STRL (SYRINGE) ×8 IMPLANT
TOWEL GREEN STERILE (TOWEL DISPOSABLE) ×8 IMPLANT
TOWEL GREEN STERILE FF (TOWEL DISPOSABLE) ×4 IMPLANT
TUBE CONNECTING 12X1/4 (SUCTIONS) ×8 IMPLANT
WATER STERILE IRR 1000ML POUR (IV SOLUTION) ×4 IMPLANT
YANKAUER SUCT BULB TIP NO VENT (SUCTIONS) ×4 IMPLANT

## 2019-12-19 NOTE — Anesthesia Procedure Notes (Signed)
Procedure Name: LMA Insertion Date/Time: 12/19/2019 12:48 PM Performed by: Shary Decamp, CRNA Pre-anesthesia Checklist: Patient identified, Emergency Drugs available, Suction available and Patient being monitored Patient Re-evaluated:Patient Re-evaluated prior to induction Oxygen Delivery Method: Circle system utilized Preoxygenation: Pre-oxygenation with 100% oxygen Induction Type: IV induction Ventilation: Mask ventilation without difficulty LMA: LMA inserted LMA Size: 4.0 Placement Confirmation: positive ETCO2 and breath sounds checked- equal and bilateral Tube secured with: Tape Dental Injury: Teeth and Oropharynx as per pre-operative assessment

## 2019-12-19 NOTE — Progress Notes (Signed)
PT Cancellation Note  Patient Details Name: Shawn Strickland MRN: [id number] DOB: [birth year]   Cancelled Treatment:    Reason Eval/Treat Not Completed: Other (comment).  Pt to OR today. Will follow up another date.   Sallyanne Kuster, PTA, CLT Acute Rehab Services [phone] 12/19/19, 11:56 AM

## 2019-12-19 NOTE — Plan of Care (Signed)
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
## 2019-12-19 NOTE — Transfer of Care (Signed)
Immediate Anesthesia Transfer of Care Note  Patient: Shawn Strickland  Procedure(s) Performed: IRRIGATION AND DEBRIDEMENT ABDOMEN (N/A Abdomen) IRRIGATION AND DEBRIDEMENT EXTREMITY BILATERAL ARMS AND LEGS (Bilateral Leg Upper) APPLICATION OF ACELL (Bilateral Leg Upper)  Patient Location: PACU  Anesthesia Type:General  Level of Consciousness: awake and confused  Airway & Oxygen Therapy: Patient Spontanous Breathing  Post-op Assessment: Report given to RN and Post -op Vital signs reviewed and stable  Post vital signs: Reviewed and stable  Last Vitals:  Vitals Value Taken Time  BP 138/84 12/19/19 1638  Temp    Pulse 121 12/19/19 1650  Resp 14 12/19/19 1650  SpO2 100 % 12/19/19 1650  Vitals shown include unvalidated device data.  Last Pain:  Vitals:   12/19/19 1047  TempSrc: Oral  PainSc:       Patients Stated Pain Goal: 0 (12/19/19 0900)  Complications: No complications documented.

## 2019-12-19 NOTE — Progress Notes (Signed)
Pt to OR today. Will follow up another date.  Lise Auer, OT Acute Rehabilitation Services [phone] Office- [phone]
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
## 2019-12-19 NOTE — Progress Notes (Signed)
Patient was just taken down to OR.
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
## 2019-12-20 ENCOUNTER — Encounter (HOSPITAL_COMMUNITY): Payer: Self-pay | Admitting: General Surgery

## 2019-12-20 NOTE — Progress Notes (Signed)
Dressings to BLE reinforced with ABD pads, new Kerlix, and new ace wraps.  Patient tolerated well.  Will continue to monitor
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
## 2019-12-20 NOTE — Anesthesia Postprocedure Evaluation (Signed)
Anesthesia Post Note  Patient: Shawn Strickland  Procedure(s) Performed: IRRIGATION AND DEBRIDEMENT ABDOMEN (N/A Abdomen) IRRIGATION AND DEBRIDEMENT EXTREMITY BILATERAL ARMS AND LEGS (Bilateral Leg Upper) APPLICATION OF ACELL (Bilateral Leg Upper)     Patient location during evaluation: PACU Anesthesia Type: General Level of consciousness: awake and alert Pain management: pain level controlled Vital Signs Assessment: post-procedure vital signs reviewed and stable Respiratory status: spontaneous breathing, nonlabored ventilation and respiratory function stable Cardiovascular status: blood pressure returned to baseline and stable Postop Assessment: no apparent nausea or vomiting Anesthetic complications: no   No complications documented.  Last Vitals:  Vitals:   12/20/19 0141 12/20/19 0414  BP: (!) 169/73 (!) 159/71  Pulse: 87 88  Resp: 18 18  Temp: 36.4 C (!) 36.4 C  SpO2: 99% 99%    Last Pain:  Vitals:   12/20/19 0502  TempSrc:   PainSc: 10-Worst pain ever                 Lowella Curb

## 2019-12-20 NOTE — Progress Notes (Signed)
OT Cancellation Note  Patient Details Name: ZYGMUND PASSERO MRN: [id number] DOB: 27-Dec-1995   Cancelled Treatment:    Reason Eval/Treat Not Completed: Other (comment); nursing currently performing dressing changes. Will follow up for OT session as able.  Marcy Siren, OT Acute Rehabilitation Services Pager [phone] Office [phone]   Orlando Penner 12/20/2019, 4:36 PM

## 2019-12-21 MED ORDER — GABAPENTIN 100 MG PO CAPS
200.0000 mg | ORAL_CAPSULE | Freq: Three times a day (TID) | ORAL | Status: DC
  Administered 2019-12-21 – 2019-12-25 (×12): 200 mg via ORAL
  Filled 2019-12-21 (×12): qty 2

## 2019-12-21 MED ORDER — OXYCODONE HCL 5 MG PO TABS
10.0000 mg | ORAL_TABLET | ORAL | Status: DC | PRN
  Administered 2019-12-21 – 2019-12-25 (×15): 15 mg via ORAL
  Filled 2019-12-21 (×6): qty 3
  Filled 2019-12-21: qty 2
  Filled 2019-12-21 (×9): qty 3

## 2019-12-21 NOTE — Progress Notes (Signed)
Patient c/o pain and c/o dressing to be sticking to wounds, reinforced dressings on bilateral extremities, repositioned and loosened dressings,  patient tolerated well.
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
## 2019-12-21 NOTE — Progress Notes (Signed)
Dressing reinforced to R thigh.  New Abd pads, kerlix, and ace wrap placed due to dislodging s/t movement.  Patient tolerated well, will continue to monitor.

## 2019-12-22 ENCOUNTER — Encounter (HOSPITAL_COMMUNITY): Payer: Self-pay | Admitting: Plastic Surgery

## 2019-12-22 MED ORDER — HYDROMORPHONE HCL 1 MG/ML IJ SOLN: 0.5000 mg | INTRAMUSCULAR | Status: DC | PRN

## 2019-12-22 NOTE — Progress Notes (Addendum)
  PATIENT ID: Shawn Strickland  MRN: [id number]  DOB/AGE:  1995/07/30 / 24 y.o.  12-15-19:  R wrist Ex fix and ORIF wrist fx/dx  Subjective: Pain reasonably controlled, c/o odor of retained dressing at perc pin sites States he has not worked at all formally with OT regarding ROM  Objective: R UE bandages with serous drainage dried at pin sites INtact but greatly diminished LT sens R/M/U.  Can flex/ext digits and thumb, but digital abduction still suspect.  Compartments soft  Assessment/Plan: 1. Pin site care can begin (no peroxide), 2. Can change gauze at pin sites daily, but leave xeroform in place to prevent dislodging pins 3. Begin aggressive DAILY formal therapy for digital ROM and Prono/supination, complemented by exercises performed on his own.  No restrictions to digital ROM or Prono/supination 4. F/u with me in 10-15 days in outpatient setting.   Cliffton Asters Janee Morn, MD      Orthopaedic & Hand Surgery [facility] [address] [place], [place]  [address] Office: [phone] Mobile: [phone]  12/22/2019, 11:13 PM
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
## 2019-12-22 NOTE — Progress Notes (Addendum)
Patient refused dressing change this morning. Patient request dressing to be done after 7 am when girlfriend is present at bedside so she can learn how to do the dressing changes at home and per patient wound care team is going to see him this morning to check on wounds and do first dressing change, will endorse accordingly.

## 2019-12-22 NOTE — Progress Notes (Signed)
Inpatient Rehab Admissions Coordinator:   Met with pt at bedside to update. I sent case to insurance today for prior authorization request.  Answered questions regarding rehab.  Will continue to follow.   Shann Medal, PT, DPT Admissions Coordinator [phone] 12/22/19  11:50 AM
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
## 2019-12-22 NOTE — Progress Notes (Signed)
3 Days Post-Op  Subjective: Resting in bed, pt at bedside. Complains of foul odor from right hand area. No f/c/n/v  Reports dressings have had to be reinforced a few times due to drainage.   Objective: Vital signs in last 24 hours: Temp:  [98.4 F (36.9 C)-99.4 F (37.4 C)] 99 F (37.2 C) (08/30 0612) Pulse Rate:  [96-101] 96 (08/30 0612) Resp:  [17-20] 18 (08/30 0612) BP: (139-157)/(64-67) 139/64 (08/30 0612) SpO2:  [98 %-100 %] 99 % (08/30 0612) Last BM Date: 12/21/19  Intake/Output from previous day: 08/29 0701 - 08/30 0700 In: 773.3 [P.O.:600; I.V.:173.3] Out: -  Intake/Output this shift: No intake/output data recorded.  General appearance: alert, cooperative, no distress and well appearing male Head: left face abrasion with dried blood Incision/Wound: Dressings in-tact over b/l UE and LE, abdomen. Right wrist ex-fix in place, right wrist dressing in place, right hand with foul odor and some sloughing exudate noted. No purulence expressed, no induration or cellulitic changes noted. Tender to palpation. Able to move all digits, good color and temp  Lab Results:  CBC Latest Ref Rng & Units 12/17/2019 12/16/2019 12/15/2019  WBC 4.0 - 10.5 K/uL 3.0(L) 5.0 8.0  Hemoglobin 13.0 - 17.0 g/dL 11.1(L) 12.8(L) 15.5  Hematocrit 39 - 52 % 34.1(L) 37.7(L) 47.0  Platelets 150 - 400 K/uL 223 217 256    BMET No results for input(s): NA, K, CL, CO2, GLUCOSE, BUN, CREATININE, CALCIUM in the last 72 hours. PT/INR No results for input(s): LABPROT, INR in the last 72 hours. ABG No results for input(s): PHART, HCO3 in the last 72 hours.  Invalid input(s): PCO2, PO2  Studies/Results: No results found.  Anti-infectives: Anti-infectives (From admission, onward)   Start     Dose/Rate Route Frequency Ordered Stop   12/19/19 0745  ceFAZolin (ANCEF) IVPB 2g/100 mL premix        2 g 200 mL/hr over 30 Minutes Intravenous On call to O.R. 12/19/19 0738 12/19/19 1240   12/15/19 1451  ceFAZolin  (ANCEF) 2-4 GM/100ML-% IVPB       Note to Pharmacy: Merril Abbe   : cabinet override      12/15/19 1451 12/16/19 0259   12/14/19 1415  amoxicillin-clavulanate (AUGMENTIN) 875-125 MG per tablet 1 tablet        1 tablet Oral Every 12 hours 12/14/19 1413 12/21/19 2103   12/13/19 2245  ceFAZolin (ANCEF) IVPB 2g/100 mL premix        2 g 200 mL/hr over 30 Minutes Intravenous  Once 12/13/19 2244 12/14/19 0051      Assessment/Plan: s/p Procedure(s): IRRIGATION AND DEBRIDEMENT ABDOMEN IRRIGATION AND DEBRIDEMENT EXTREMITY BILATERAL ARMS AND LEGS APPLICATION OF ACELL  Begin dressing changes daily, starting today. I did change patients hand dressing and noted some sloughing exudate and devitalized epithelium. No sign of infection or cellulitic changes. Reapplied KY jelly to hand dressing along with some adaptic.   Continue with dressing changes daily. Discussed plan with patient moving forward.  Will continue to follow.       LOS: 8 days    Leslee Home, PA-C 12/22/2019
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
## 2019-12-24 NOTE — Progress Notes (Signed)
Inpatient Rehab Admissions Coordinator:   Discussed case with rehab MD, Dr. Carlis Abbott.  With pt mobilizing at supervision level, he no longer meets criteria for CIR admit.  I did speak to the patient and his mother at the bedside to let them know.  We discussed that he will still need therapy follow up to continue to work on functional mobility, and his RUE.  They have questions regarding dressing changes and medical f/u, which I deferred to the trauma team.  CIR will sign off at this time.    Estill Dooms, PT, DPT Admissions Coordinator [phone] 12/24/19  12:11 PM

## 2019-12-24 NOTE — Plan of Care (Signed)
  Problem: Education: Goal: Knowledge of General Education information will improve Description: Including pain rating scale, medication(s)/side effects and non-pharmacologic comfort measures Outcome: Progressing   Problem: Clinical Measurements: Goal: Will remain free from infection Outcome: Progressing   Problem: Activity: Goal: Risk for activity intolerance will decrease Outcome: Progressing   
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
## 2019-12-24 NOTE — Progress Notes (Signed)
5 Days Post-Op  Subjective: Resting in bed, mother at bedside, RN at bedside changing dressings.  Patient reports dressing changes have been more manageable recently.  Pain has improved.  No complaints.  Objective: Vital signs in last 24 hours: Temp:  [97.9 F (36.6 C)-98.4 F (36.9 C)] 98.4 F (36.9 C) (09/01 1500) Pulse Rate:  [71-88] 79 (09/01 1500) Resp:  [18] 18 (09/01 1500) BP: (134-175)/(59-80) 175/80 (09/01 1500) SpO2:  [100 %] 100 % (09/01 1500) Last BM Date: 12/21/19  Intake/Output from previous day: 08/31 0701 - 09/01 0700 In: 960 [P.O.:960] Out: -  Intake/Output this shift: Total I/O In: 240 [P.O.:240] Out: -   General appearance: alert, cooperative, no distress and Sitting up in bed. Eyes: Left conjunctiva hemorrhage noted Incision/Wound: Bilateral lower extremities and bilateral upper extremities wrapped with dressings, abdomen with dressing in place.  Right knee exposed with abrasion that appears to be healing without any surrounding erythema.  Left face abrasion without any surrounding erythema, scabbing dried blood noted.  Lab Results:  CBC Latest Ref Rng & Units 12/17/2019 12/16/2019 12/15/2019  WBC 4.0 - 10.5 K/uL 3.0(L) 5.0 8.0  Hemoglobin 13.0 - 17.0 g/dL 11.1(L) 12.8(L) 15.5  Hematocrit 39 - 52 % 34.1(L) 37.7(L) 47.0  Platelets 150 - 400 K/uL 223 217 256    BMET No results for input(s): NA, K, CL, CO2, GLUCOSE, BUN, CREATININE, CALCIUM in the last 72 hours. PT/INR No results for input(s): LABPROT, INR in the last 72 hours. ABG No results for input(s): PHART, HCO3 in the last 72 hours.  Invalid input(s): PCO2, PO2  Studies/Results: No results found.  Anti-infectives: Anti-infectives (From admission, onward)   Start     Dose/Rate Route Frequency Ordered Stop   12/19/19 0745  ceFAZolin (ANCEF) IVPB 2g/100 mL premix        2 g 200 mL/hr over 30 Minutes Intravenous On call to O.R. 12/19/19 0738 12/19/19 1240   12/15/19 1451  ceFAZolin (ANCEF) 2-4  GM/100ML-% IVPB       Note to Pharmacy: Merril Abbe   : cabinet override      12/15/19 1451 12/16/19 0259   12/14/19 1415  amoxicillin-clavulanate (AUGMENTIN) 875-125 MG per tablet 1 tablet        1 tablet Oral Every 12 hours 12/14/19 1413 12/21/19 2103   12/13/19 2245  ceFAZolin (ANCEF) IVPB 2g/100 mL premix        2 g 200 mL/hr over 30 Minutes Intravenous  Once 12/13/19 2244 12/14/19 0051      Assessment/Plan: s/p Procedure(s): IRRIGATION AND DEBRIDEMENT ABDOMEN IRRIGATION AND DEBRIDEMENT EXTREMITY BILATERAL ARMS AND LEGS APPLICATION OF ACELL  Patient being discharged - he is stable from plastic surgery stance, going to [facility] with mother for outpatient rehab and wound care management by mother.    Recommend continuing with K-Y jelly dressing changes daily, 4 x 4 gauze, ABD pads, Kerlix, Ace wraps as needed.  Patient can follow-up in our office next week on Wednesday or Thursday for reevaluation and further updates to the wound care management plan.  Patient and mother in agreement with this plan, if they have any questions or concerns they can call our office prior to their appointment.   LOS: 10 days    Leslee Home, PA-C 12/24/2019

## 2019-12-24 NOTE — Plan of Care (Signed)

## 2019-12-25 ENCOUNTER — Encounter: Payer: Self-pay | Admitting: Emergency Medicine

## 2019-12-25 MED ORDER — OXYCODONE HCL 10 MG PO TABS
10.0000 mg | ORAL_TABLET | ORAL | 0 refills | Status: DC | PRN
Start: 2019-12-25 — End: 2021-03-23

## 2019-12-25 MED ORDER — ACETAMINOPHEN 500 MG PO TABS: 1000.0000 mg | ORAL_TABLET | Freq: Four times a day (QID) | ORAL | Status: DC | PRN

## 2019-12-25 MED ORDER — POLYETHYLENE GLYCOL 3350 17 G PO PACK: 17.0000 g | PACK | Freq: Every day | ORAL | 0 refills | Status: DC

## 2019-12-25 MED ORDER — METHOCARBAMOL 500 MG PO TABS: 1000.0000 mg | ORAL_TABLET | Freq: Three times a day (TID) | ORAL | 0 refills | Status: DC | PRN

## 2019-12-25 MED ORDER — IBUPROFEN 600 MG PO TABS
600.0000 mg | ORAL_TABLET | Freq: Three times a day (TID) | ORAL | 0 refills | Status: DC | PRN
Start: 2019-12-25 — End: 2021-03-23

## 2019-12-25 MED ORDER — GABAPENTIN 100 MG PO CAPS
200.0000 mg | ORAL_CAPSULE | Freq: Three times a day (TID) | ORAL | 0 refills | Status: DC
Start: 2019-12-25 — End: 2021-03-23

## 2019-12-25 MED FILL — METHOCARBAMOL 500 MG TABS: 500 | 10 days supply | Qty: 60 | Fill #0

## 2019-12-25 MED FILL — oxyCODONE HCL 10 MG TABS: 10 | 5 days supply | Qty: 40 | Fill #0

## 2019-12-25 MED FILL — GABAPENTIN 100 MG CAPSULE: 100 | 10 days supply | Qty: 60 | Fill #0

## 2019-12-25 NOTE — Plan of Care (Signed)
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
## 2019-12-25 NOTE — Discharge Instructions (Signed)
Recommend continuing with K-Y jelly dressing changes daily, 4 x 4 gauze, ABD pads, Kerlix, Ace wraps as needed.  Per. Dr. Janee Mornhompson, Hand surgeon 1. Pin site care can begin (no peroxide), 2. Can change gauze at pin sites daily, but leave xeroform in place to prevent dislodging pins 3. Begin aggressive DAILY formal therapy for digital ROM and Prono/supination, complemented by exercises performed on his own.  No restrictions to digital ROM or Prono/supination 4. F/u with me in 10-15 days in outpatient setting.   Zygoma Fracture  A zygoma fracture is a break in one of the bones in the face. The zygoma forms the part of your cheekbone that you can feel under your eye. The main part of the zygoma meets the bone that forms the middle part of your face (maxillary bone) under your eye. The zygoma also has an arched part that extends along the side of your face to meet the bone that forms the side of your head (temporalbone).  A zygoma fracture may involve the main part of the bone, the arch of the bone, or both parts of the bone. What are the causes? This condition may be caused by an injury or trauma to the face, which can happen from:  A car accident.  A direct blow to the face.  A sports injury.  A fall. What increases the risk? You are more likely to develop this condition if:  You play contact sports.  You are a victim of violence or participate in violent activities or behaviors. What are the signs or symptoms? Symptoms of this condition include:  Swelling.  Bruising.  Pain.  Difficulty or pain when chewing.  A feeling that your teeth are out of line. As the swelling goes down, your face may look different because your cheekbone is flat or set back (depressed). If the fracture extends into bones that support your eye (blowout fracture), you may have double vision or numbness in your cheek. How is this diagnosed? This condition may be diagnosed based on:  Your symptoms and  description of the injury.  A physical exam. Your health care provider will check your cheekbone area and feel whether your zygoma is depressed or separated.  Tests to confirm the diagnosis and check for other injuries. These tests may include: ? X-rays. ? CT scan. ? Eye exam. How is this treated? Treatment depends on the type of fracture you have and how severe it is. You may have to wait for treatment until the swelling and inflammation decrease. This injury may be treated with:  Rest. If you have a fracture that does not cause any deformity or change in your chewing (non-displaced fracture), you may not need treatment.  Taking medicine for pain.  Surgery. You may need surgery if you have trouble opening your mouth or if you have a cheekbone deformity (displaced fracture). Surgery may involve either of the following: ? A closed reduction. A small incision is made inside your mouth or on the side of your head. The surgeon inserts a smooth, blunt surgical instrument through the incision to lift the bone back into proper position. ? An open reduction. This may require a small incision over the fracture site. The fracture is put back into proper position. It will be held in place with wires or with screws and metal plates. Follow these instructions at home: Activity  Rest as told by your health care provider.  Avoid lying down on your injured side. It may help to sleep  in a sitting position. Try sleeping in a reclining chair or propping yourself up with extra pillows in bed.  Do not participate in activities that put you at risk for injuring the area again.  Wear protective gear as told by your health care provider, especially when participating in sports or activities that put you at risk for re-injury. Medicines  Take over-the-counter and prescription medicines only as told by your health care provider.  Do not drive or use heavy machinery while taking prescription pain  medicine.  If you are taking prescription pain medicine, take actions to prevent or treat constipation. Your health care provider may recommend that you: ? Drink enough fluid to keep your urine pale yellow. ? Eat foods that are high in fiber, such as fresh fruits and vegetables, whole grains, and beans. ? Limit foods that are high in fat and processed sugars, such as fried or sweet foods. ? Take an over-the-counter or prescription medicine for constipation. General instructions   Avoid blowing your nose.  Wash and dry your face gently.  Do not use any products that contain nicotine or tobacco, such as cigarettes and e-cigarettes. These can delay bone healing. If you need help quitting, ask your health care provider.  Follow instructions from your health care provider about eating or drinking restrictions. Eat a soft or liquid diet until your health care provider says it is okay for you to chew.  If directed, put ice on the injured area. ? Put ice in a plastic bag. ? Place a towel between your skin and the bag. ? Leave the ice on for 20 minutes, 2-3 times a day.  Keep all follow-up visits as told by your health care provider. This is important. Contact a health care provider if:  Pain or inflammation does not decrease with medicines.  Swelling or bruising of the injured area gets worse.  You develop any vision problems.  You have a lot of clear watery discharge from your nose.  You have a fever.  You have nausea or vomiting. Get help right away if you:  Have increased trouble moving your mouth.  Have trouble breathing or swallowing.  Develop a severe headache. Summary  A zygoma fracture is a break in one of the bones in the face. The zygoma forms the part of your cheekbone that you can feel under your eye.  This condition may be caused by an injury or trauma to the face.  Avoid blowing your nose and lying down on your injured side.  Treatment depends on the type of  fracture you have and how severe it is. You may have to wait for treatment until the swelling and inflammation decrease. This information is not intended to replace advice given to you by your health care provider. Make sure you discuss any questions you have with your health care provider. Document Revised: 07/03/2017 Document Reviewed: 03/28/2017 Elsevier Patient Education  2020 [employer]   Orbital Floor Fracture  An orbital floor fracture is a break in the bottom part of the bony structure that protects the eye (orbital floor). The orbital floor separates the eye from the sinus. If the fracture in the orbital floor allows nearby muscle, tissue, or both to get trapped inside of the fracture, it is called an orbital floor fracture with entrapment. This usually causes swelling and pain, and it often affects vision. Orbital floor fracture with entrapment may also be called a trapdoor fracture. This is more commonly seen in children and may  cause a nervous system reaction that requires immediate treatment. What are the causes? This condition is caused by an injury to the eye, such as a hard, direct hit. What are the signs or symptoms? Symptoms of this condition include:  Swelling and bruising around the eye.  Pain or tenderness around the eye.  Difficulty looking up, down, or side to side.  Changes in vision, such as blurry vision or seeing two of everything (double vision).  The injured eye looking sunken compared to the other eye.  Numbness of the cheek, inner nose, and upper gum on the same side of the face as the injury. How is this diagnosed? This condition may be diagnosed based on:  Your symptoms and medical history.  An eye exam.  An X-ray or a CT scan. How is this treated? Treatment for this condition depends on the severity and type of fracture, if there is entrapment, and how old you are. Orbital floor fracture without entrapment Treatment is usually not needed if  there is no entrapment. In almost all cases, the broken bone heals on its own with time. To help manage symptoms, treatment may include:  Applying ice to the injured area.  Taking medicines for pain relief.  Taking antibiotics to treat or prevent infection.  Taking steroids to reduce swelling.  Making an appointment with an eye specialist (ophthalmologist) for follow-up care. Orbital floor fracture with entrapment If there is entrapment, treatment is usually started after all the swelling around the eye has gone away, which may take 1-2 weeks. After swelling goes away, an ophthalmologist may try to free the entrapped tissue if you still have double vision. If this is not possible, you may need surgery. If you have double vision only when looking up, your health care provider will discuss treatment options with you. Some people who do not spend a lot of time looking up choose not to have more treatment. Others who need to look up often for work, such as Medical sales representative, need treatment. Orbital trapdoor fracture You may need surgery sooner if you have a trapdoor fracture or have the following symptoms:  Nausea or vomiting.  A slow heart rate.  Dizziness.  Loss of consciousness. Follow these instructions at home: Activity  Do not drive or operate machinery until your health care provider says that it is safe. Be aware that if you are only using one eye to see, you may have trouble judging distances (depth perception). Ask your health care provider when it is safe to drive.  Avoid traveling by plane or going to high-altitude areas. This may slow the healing of your swelling and may increase sinus pain.  Return to your normal activities as told by your health care provider. Ask your health care provider what activities are safe for you.  Follow instructions from your health care provider about wearing protective glasses or goggles for work. Managing pain, bruising, and swelling   If  directed, put ice on the injured eye. To do this: ? Put ice in a plastic bag. ? Place a towel between your skin and the bag. ? Leave the ice on for 20 minutes, 2-3 times a day.  If recommended by your health care provider, sleep with one or two extra pillows under your head. Keeping your head raised slightly when lying down can help with swelling and pain. Medicines  Take over-the-counter and prescription medicines only as told by your health care provider.  If you were prescribed an antibiotic medicine, take it  as told by your health care provider. Do not stop taking the antibiotic even if you start to feel better. General instructions  Do not touch, rub, or try to move your eye.  Do not blow your nose until your health care provider says it is okay.  Do not put a contact lens in the injured eye until your health care provider says it is okay.  Stay away from dusty areas.  Keep all follow-up visits as told by your health care provider. This is important. Contact a health care provider if:  Your vision changes, such as increased blurriness or worsening double vision.  You feel pain when you move your eyes.  You have nausea or feel light-headed when you move your eyes.  You have redness or swelling that does not go away or gets worse.  You have blood or fluid coming from your nose.  You have a fever.  You have a headache. Get help right away if you:  Have a sensation that you are seeing flashing lights.  Have sudden blindness.  Have sudden bulging of the injured eye.  Notice that your heart is beating much slower than normal.  Have dizziness.  Pass out.  Have chest pain.  Are short of breath. Summary  An orbital floor fracture is a break in the bottom part of the bony structure that protects the eye (orbital floor).  This condition may cause swelling, bruising, and pain around the injured eye. You may also have trouble looking up, down, or side to  side.  Treatment depends on the severity and type of fracture, if there is entrapment, and how old you are.  You should notdrive or perform your regular activities until your health care provider says that it is safe. This information is not intended to replace advice given to you by your health care provider. Make sure you discuss any questions you have with your health care provider. Document Revised: 11/20/2018 Document Reviewed: 02/22/2017 Elsevier Patient Education  2020 [employer].
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
## 2020-01-01 ENCOUNTER — Encounter: Payer: Self-pay | Admitting: Surgical

## 2020-01-01 ENCOUNTER — Other Ambulatory Visit: Payer: Self-pay

## 2020-01-01 ENCOUNTER — Ambulatory Visit (INDEPENDENT_AMBULATORY_CARE_PROVIDER_SITE_OTHER): Payer: BC Managed Care – PPO | Admitting: Surgical

## 2020-01-01 DIAGNOSIS — S0081XD Abrasion of other part of head, subsequent encounter: Secondary | ICD-10-CM | POA: Diagnosis not present

## 2020-01-01 DIAGNOSIS — S80819D Abrasion, unspecified lower leg, subsequent encounter: Secondary | ICD-10-CM

## 2020-01-01 DIAGNOSIS — S2091XD Abrasion of unspecified parts of thorax, subsequent encounter: Secondary | ICD-10-CM | POA: Diagnosis not present

## 2020-01-01 DIAGNOSIS — S40819D Abrasion of unspecified upper arm, subsequent encounter: Secondary | ICD-10-CM
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
## 2020-01-02 ENCOUNTER — Telehealth: Payer: Self-pay

## 2020-01-02 NOTE — Telephone Encounter (Signed)
Faxed order to [facility]: Kerlix, 4x4s, xeroform, and Ace wrap. Change every other day

## 2020-01-06 ENCOUNTER — Telehealth: Payer: Self-pay

## 2020-01-06 NOTE — Telephone Encounter (Signed)
Called [facility] and spoke with Tameka. Products have not been sent out due to insurance clarification. Patient had verified with [facility] that he has [employer]. They are still working on verifying information prior to sending out

## 2020-01-09 ENCOUNTER — Telehealth: Payer: Self-pay

## 2020-01-09 NOTE — Telephone Encounter (Signed)
Called Shawn Strickland from [facility] to check on status of order. Patient has Shawn Strickland and his deductible has not been met. Thedore Mins offered patient products at a discount price. Patient has not ordered supplies.

## 2020-01-15 ENCOUNTER — Other Ambulatory Visit: Payer: Self-pay

## 2020-01-15 ENCOUNTER — Encounter: Payer: Self-pay | Admitting: Surgical

## 2020-01-15 ENCOUNTER — Ambulatory Visit (INDEPENDENT_AMBULATORY_CARE_PROVIDER_SITE_OTHER): Payer: BC Managed Care – PPO | Admitting: Surgical

## 2020-01-15 VITALS — BP 105/63 | HR 79 | Temp 98.5°F

## 2020-01-15 DIAGNOSIS — S40819D Abrasion of unspecified upper arm, subsequent encounter: Secondary | ICD-10-CM | POA: Diagnosis not present

## 2020-01-15 DIAGNOSIS — S0081XD Abrasion of other part of head, subsequent encounter: Secondary | ICD-10-CM

## 2020-01-15 DIAGNOSIS — S2091XD Abrasion of unspecified parts of thorax, subsequent encounter: Secondary | ICD-10-CM | POA: Diagnosis not present

## 2020-01-15 DIAGNOSIS — S80819D Abrasion, unspecified lower leg, subsequent encounter: Secondary | ICD-10-CM | POA: Diagnosis not present
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
## 2020-01-26 ENCOUNTER — Telehealth: Payer: Self-pay

## 2020-01-26 NOTE — Telephone Encounter (Signed)
Patient called to state that he would like to order collagen sheets as per his discussion with Keenan Bachelor, PA-C during his last visit on 01/15/2020.

## 2020-01-27 ENCOUNTER — Telehealth: Payer: Self-pay

## 2020-01-27 NOTE — Telephone Encounter (Signed)
Faxed [facility] order for collagen sheets to be applied every other day.

## 2020-01-27 NOTE — Telephone Encounter (Signed)
Returned patients call. Advised patient that I faxed an order to [facility] for Collagen sheets.

## 2020-02-10 ENCOUNTER — Encounter: Payer: Self-pay | Admitting: Surgical

## 2020-02-10 ENCOUNTER — Other Ambulatory Visit: Payer: Self-pay

## 2020-02-10 ENCOUNTER — Ambulatory Visit (INDEPENDENT_AMBULATORY_CARE_PROVIDER_SITE_OTHER): Payer: BC Managed Care – PPO | Admitting: Surgical

## 2020-02-10 VITALS — BP 131/77 | HR 66 | Temp 98.4°F

## 2020-02-10 DIAGNOSIS — S2091XD Abrasion of unspecified parts of thorax, subsequent encounter: Secondary | ICD-10-CM | POA: Diagnosis not present

## 2020-02-10 DIAGNOSIS — S80819D Abrasion, unspecified lower leg, subsequent encounter: Secondary | ICD-10-CM | POA: Diagnosis not present

## 2020-02-10 DIAGNOSIS — S0081XD Abrasion of other part of head, subsequent encounter: Secondary | ICD-10-CM | POA: Diagnosis not present

## 2020-02-10 DIAGNOSIS — S40819D Abrasion of unspecified upper arm, subsequent encounter: Secondary | ICD-10-CM | POA: Diagnosis not present
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
## 2020-04-13 ENCOUNTER — Telehealth (INDEPENDENT_AMBULATORY_CARE_PROVIDER_SITE_OTHER): Payer: BC Managed Care – PPO | Admitting: Surgical

## 2020-04-13 ENCOUNTER — Other Ambulatory Visit: Payer: Self-pay

## 2020-04-13 ENCOUNTER — Encounter: Payer: Self-pay | Admitting: Surgical

## 2020-04-13 DIAGNOSIS — S0081XD Abrasion of other part of head, subsequent encounter: Secondary | ICD-10-CM

## 2020-04-13 DIAGNOSIS — S80819D Abrasion, unspecified lower leg, subsequent encounter: Secondary | ICD-10-CM | POA: Diagnosis not present

## 2020-04-13 DIAGNOSIS — S40819D Abrasion of unspecified upper arm, subsequent encounter: Secondary | ICD-10-CM | POA: Diagnosis not present
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
## 2020-06-16 ENCOUNTER — Ambulatory Visit: Payer: BC Managed Care – PPO | Admitting: Surgical

## 2020-06-30 ENCOUNTER — Ambulatory Visit: Payer: BC Managed Care – PPO | Admitting: Surgical

## 2021-03-23 ENCOUNTER — Ambulatory Visit
Admission: EM | Admit: 2021-03-23 | Discharge: 2021-03-23 | Disposition: A | Discharge status: Home / Self Care | Payer: BC Managed Care – PPO | Attending: Family Medicine | Admitting: Family Medicine

## 2021-03-23 ENCOUNTER — Other Ambulatory Visit: Payer: Self-pay

## 2021-03-23 ENCOUNTER — Ambulatory Visit: Payer: Self-pay | Admitting: *Deleted

## 2021-03-23 DIAGNOSIS — Z20828 Contact with and (suspected) exposure to other viral communicable diseases: Secondary | ICD-10-CM

## 2021-03-23 DIAGNOSIS — J069 Acute upper respiratory infection, unspecified: Secondary | ICD-10-CM

## 2021-03-23 MED ORDER — OSELTAMIVIR PHOSPHATE 75 MG PO CAPS: 75.0000 mg | ORAL_CAPSULE | Freq: Two times a day (BID) | ORAL | 0 refills | Status: AC

## 2021-03-23 MED ORDER — PROMETHAZINE-DM 6.25-15 MG/5ML PO SYRP: 5.0000 mL | ORAL_SOLUTION | Freq: Four times a day (QID) | ORAL | 0 refills | Status: DC | PRN

## 2021-03-23 NOTE — Telephone Encounter (Signed)
Pt reports fever of 101.9 "Unable to get it down" x 3 days. Reports frontal headache, sore throat, dry cough. Sore throat worse symptom. No PCP. Advised UC. Pt states will follow disposition.     Reason for Disposition  Fever present > 3 days (72 hours)  Answer Assessment - Initial Assessment Questions 1. ONSET: "When did the throat start hurting?" (Hours or days ago)      Sunday night, worsening 2. SEVERITY: "How bad is the sore throat?" (Scale 1-10; mild, moderate or severe)   - MILD (1-3):  doesn\'t interfere with eating or normal activities   - MODERATE (4-7): interferes with eating some solids and normal activities   - SEVERE (8-10):  excruciating pain, interferes with most normal activities   - SEVERE DYSPHAGIA: can\'t swallow liquids, drooling      3. STREP EXPOSURE: "Has there been any exposure to strep within the past week?" If Yes, ask: "What type of contact occurred?"      unknown 4.  VIRAL SYMPTOMS: "Are there any symptoms of a cold, such as a runny nose, cough, hoarse voice or red eyes?"      Runny nose 5. FEVER: "Do you have a fever?" If Yes, ask: "What is your temperature, how was it measured, and when did it start?"     10 1.9 6. PUS ON THE TONSILS: "Is there pus on the tonsils in the back of your throat?"     Unsure 7. OTHER SYMPTOMS: "Do you have any other symptoms?" (e.g., difficulty breathing, headache, rash)     Headache, cough  Protocols used: Sore Throat-A-AH

## 2021-03-23 NOTE — ED Triage Notes (Signed)
Patient states that since Monday he's had a sore throat, runny nose , fever of 102.0 this morning.   Patient states he tried OTC without any relief.

## 2021-03-23 NOTE — ED Provider Notes (Signed)
[facility]   [id number] 03/23/21 Arrival Time: 1035  ASSESSMENT & PLAN:  1. Viral URI with cough   2. Exposure to the flu    Discussed typical duration of viral illnesses. COVID-19/influenza testing sent. Work note provided. OTC symptom care as needed.  Meds ordered this encounter  Medications   oseltamivir (TAMIFLU) 75 MG capsule    Sig: Take 1 capsule (75 mg total) by mouth 2 (two) times daily for 5 days.    Dispense:  10 capsule    Refill:  0   promethazine-dextromethorphan (PROMETHAZINE-DM) 6.25-15 MG/5ML syrup    Sig: Take 5 mLs by mouth 4 (four) times daily as needed for cough.    Dispense:  118 mL    Refill:  0     Follow-up Information     [facility].   Specialty: Urgent Care Why: As needed. Contact information: [address], [address] [place] [address] [phone]                Reviewed expectations re: course of current medical issues. Questions answered. Outlined signs and symptoms indicating need for more acute intervention. Understanding verbalized. After Visit Summary given.   SUBJECTIVE: History from: patient. Shawn Strickland is a 25 y.o. male who reports: ST, cough, body aches, T 102 F; abrupt onset x 48 hours. Denies: difficulty breathing. Normal PO intake without n/v/d.   OBJECTIVE:  Vitals:   03/23/21 1307  BP: 121/79  Pulse: 89  Resp: 18  Temp: 98.5 F (36.9 C)  TempSrc: Oral  SpO2: 96%    General appearance: alert; no distress Eyes: PERRLA; EOMI; conjunctiva normal HENT: [place]; AT; with nasal congestion Neck: supple  Lungs: speaks full sentences without difficulty; unlabored; clear Extremities: no edema Skin: warm and dry Neurologic: normal gait Psychological: alert and cooperative; normal mood and affect  Labs: Labs Reviewed  COVID-19, FLU A+B NAA    No Known Allergies  History reviewed. No pertinent past medical history. Social History    Socioeconomic History   Marital status: Single    Spouse name: Not on file   Number of children: Not on file   Years of education: Not on file   Highest education level: Not on file  Occupational History   Not on file  Tobacco Use   Smoking status: Never   Smokeless tobacco: Never  Vaping Use   Vaping Use: Never used  Substance and Sexual Activity   Alcohol use: Not Currently    Alcohol/week: 2.0 standard drinks    Types: 2 Standard drinks or equivalent per week    Comment: Occasionally   Drug use: Never   Sexual activity: Yes  Other Topics Concern   Not on file  Social History Narrative   ** Merged History Encounter **       Social Determinants of Health   Financial Resource Strain: Not on file  Food Insecurity: Not on file  Transportation Needs: Not on file  Physical Activity: Not on file  Stress: Not on file  Social Connections: Not on file  Intimate Partner Violence: Not on file   History reviewed. No pertinent family history. Past Surgical History:  Procedure Laterality Date   APPLICATION OF A-CELL OF EXTREMITY Bilateral 12/19/2019   Procedure: APPLICATION OF ACELL;  Surgeon: Allena Napoleon, MD;  Location: [facility] OR;  Service: Plastics;  Laterality: Bilateral;   EXTERNAL FIXATION ARM Right 12/15/2019   Procedure: REDUCTION AND EXTERNAL FIXATION RIGHT WRIST;  Surgeon:  Mack Hook, MD;  Location: [facility] OR;  Service: Orthopedics;  Laterality: Right;   I & D EXTREMITY Bilateral 12/19/2019   Procedure: IRRIGATION AND DEBRIDEMENT EXTREMITY BILATERAL ARMS AND LEGS;  Surgeon: Allena Napoleon, MD;  Location: [facility] OR;  Service: Plastics;  Laterality: Bilateral;   INCISION AND DRAINAGE OF WOUND N/A 12/15/2019   Procedure: IRRIGATION AND DEBRIDEMENT OF BILATERAL LOWER EXTREMITIES, BILATERAL UPPER EXTREMITIES,  AND ABDOMINAL WALL;  Surgeon: Diamantina Monks, MD;  Location: [facility] OR;  Service: General;  Laterality: N/A;   INCISION AND DRAINAGE OF WOUND N/A 12/19/2019   Procedure:  IRRIGATION AND DEBRIDEMENT ABDOMEN;  Surgeon: Allena Napoleon, MD;  Location: [facility] OR;  Service: Plastics;  Laterality: N/AMardella Layman, MD 03/23/21 1329

## 2021-03-24 LAB — COVID-19, FLU A+B NAA
Influenza A, NAA: NOT DETECTED
Influenza B, NAA: NOT DETECTED
SARS-CoV-2, NAA: DETECTED — AB
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
## 2022-07-17 ENCOUNTER — Ambulatory Visit
Admission: RE | Admit: 2022-07-17 | Discharge: 2022-07-17 | Disposition: A | Discharge status: Home / Self Care | Payer: BC Managed Care – PPO | Source: Ambulatory Visit

## 2022-07-17 VITALS — BP 134/70 | HR 59 | Temp 98.5°F | Resp 17

## 2022-07-17 DIAGNOSIS — H5789 Other specified disorders of eye and adnexa: Secondary | ICD-10-CM | POA: Diagnosis not present

## 2022-07-17 NOTE — Discharge Instructions (Signed)
As we discussed, I suspect your left eye has a little bit of irritation from something being in it possibly.  The stain did not show a corneal abrasion today and I do not believe you have pinkeye or allergic conjunctivitis.  You can use an over-the-counter eyedrop like blink to help keep your eye lubricated and help with the dryness.  Please follow-up with [place] or primary care provider if symptoms persist or worsen despite this treatment.

## 2022-07-17 NOTE — ED Triage Notes (Signed)
Pt states he is having redness, irritation, watery eyes with itching on left eye that started yesterday.

## 2022-07-17 NOTE — ED Provider Notes (Signed)
[facility]    CSN: [id number] Arrival date & time: 07/17/22  1543      History   Chief Complaint Chief Complaint  Patient presents with   Eye Problem    I believe I have pink eye - Entered by patient   Conjunctivitis    HPI Shawn Strickland is a 27 y.o. male.   Patient presents today with left inner eye irritation that began yesterday when he woke up.  He denies significant drainage or pus from the eye.  No blurred vision, double vision.  He has developed a headache and some cough/congestion over the past couple of days.  Reports his left eye feels a little bit watery and irritated.  He feels like there is a piece of sand in his left eye.  Patient does not wear contacts.  Has not taken anything for symptoms so far.  Reports he has a 67-month-old infant at home and wants to make sure it is not pinkeye.    History reviewed. No pertinent past medical history.  Patient Active Problem List   Diagnosis Date Noted   Facial fracture ([place]) 12/14/2019    Past Surgical History:  Procedure Laterality Date   APPLICATION OF A-CELL OF EXTREMITY Bilateral 12/19/2019   Procedure: APPLICATION OF ACELL;  Surgeon: Cindra Presume, MD;  Location: [place] [facility];  Service: Plastics;  Laterality: Bilateral;   EXTERNAL FIXATION ARM Right 12/15/2019   Procedure: REDUCTION AND EXTERNAL FIXATION RIGHT WRIST;  Surgeon: Milly Jakob, MD;  Location: [place];  Service: Orthopedics;  Laterality: Right;   I & D EXTREMITY Bilateral 12/19/2019   Procedure: IRRIGATION AND DEBRIDEMENT EXTREMITY BILATERAL ARMS AND LEGS;  Surgeon: Cindra Presume, MD;  Location: [facility];  Service: Plastics;  Laterality: Bilateral;   INCISION AND DRAINAGE OF WOUND N/A 12/15/2019   Procedure: IRRIGATION AND DEBRIDEMENT OF BILATERAL LOWER EXTREMITIES, BILATERAL UPPER EXTREMITIES,  AND ABDOMINAL WALL;  Surgeon: Jesusita Oka, MD;  Location: [facility];  Service: General;  Laterality: N/A;   INCISION AND DRAINAGE OF WOUND N/A 12/19/2019    Procedure: IRRIGATION AND DEBRIDEMENT ABDOMEN;  Surgeon: Cindra Presume, MD;  Location: [facility];  Service: Plastics;  Laterality: N/A;       Home Medications    Prior to Admission medications   Medication Sig Start Date End Date Taking? Authorizing Provider  acetaminophen (TYLENOL) 500 MG tablet Take 2 tablets (1,000 mg total) by mouth every 6 (six) hours as needed. Patient not taking: Reported on 02/10/2020 12/25/19   Saverio Danker, PA-C  promethazine-dextromethorphan (PROMETHAZINE-DM) 6.25-15 MG/5ML syrup Take 5 mLs by mouth 4 (four) times daily as needed for cough. 03/23/21   Vanessa Kick, MD    Family History History reviewed. No pertinent family history.  Social History Social History   Tobacco Use   Smoking status: Never   Smokeless tobacco: Never  Vaping Use   Vaping Use: Never used  Substance Use Topics   Alcohol use: Not Currently    Alcohol/week: 2.0 standard drinks of alcohol    Types: 2 Standard drinks or equivalent per week    Comment: Occasionally   Drug use: Never     Allergies   Patient has no known allergies.   Review of Systems Review of Systems Per HPI  Physical Exam Triage Vital Signs ED Triage Vitals  Enc Vitals Group     BP 07/17/22 1554 134/70     Pulse Rate 07/17/22 1554 (!) 59     Resp 07/17/22 1554  17     Temp 07/17/22 1554 98.5 F (36.9 C)     Temp Source 07/17/22 1554 Oral     SpO2 07/17/22 1554 97 %     Weight --      Height --      Head Circumference --      Peak Flow --      Pain Score 07/17/22 1555 1     Pain Loc --      Pain Edu? --      Excl. in [place]? --    No data found.  Updated Vital Signs BP 134/70 (BP Location: Right Arm)   Pulse (!) 59   Temp 98.5 F (36.9 C) (Oral)   Resp 17   SpO2 97%   Visual Acuity Right Eye Distance:   Left Eye Distance:   Bilateral Distance:    Right Eye Near:   Left Eye Near:    Bilateral Near:     Physical Exam Vitals and nursing note reviewed.  Constitutional:       General: He is not in acute distress.    Appearance: Normal appearance. He is not toxic-appearing.  HENT:     Head: Normocephalic and atraumatic.  Eyes:     General: No scleral icterus.       Right eye: No discharge.        Left eye: No discharge.     Extraocular Movements: Extraocular movements intact.     Right eye: Normal extraocular motion.     Left eye: Normal extraocular motion.     Conjunctiva/sclera:     Right eye: Right conjunctiva is not injected. No exudate or hemorrhage.    Left eye: Left conjunctiva is not injected. No exudate or hemorrhage.    Pupils: Pupils are equal, round, and reactive to light.     Left eye: No fluorescein uptake.      Comments: Slight erythema to left medial conjunctiva in area marked  Pulmonary:     Effort: Pulmonary effort is normal. No respiratory distress.  Skin:    General: Skin is warm and dry.     Coloration: Skin is not jaundiced or pale.     Findings: No erythema.  Neurological:     Mental Status: He is alert and oriented to person, place, and time.  Psychiatric:        Behavior: Behavior is cooperative.      UC Treatments / Results  Labs (all labs ordered are listed, but only abnormal results are displayed) Labs Reviewed - No data to display  EKG   Radiology No results found.  Procedures Procedures (including critical care time)  Medications Ordered in UC Medications - No data to display  Initial Impression / Assessment and Plan / UC Course  I have reviewed the triage vital signs and the nursing notes.  Pertinent labs & imaging results that were available during my care of the patient were reviewed by me and considered in my medical decision making (see chart for details).  Patient is well-appearing, normotensive, afebrile, not tachycardic, not tachypneic, oxygenating well on room air.     1. Irritation of left eye Vital signs and examination today are reassuring Fluorescein stain did not show uptake today Treat  irritation with lubricating eyedrops, follow-up with PCP with no improvement or worsening symptoms despite treatment  The patient was given the opportunity to ask questions.  All questions answered to their satisfaction.  The patient is in agreement to this plan.  Final Clinical Impressions(s) / UC Diagnoses   Final diagnoses:  Irritation of left eye     Discharge Instructions      As we discussed, I suspect your left eye has a little bit of irritation from something being in it possibly.  The stain did not show a corneal abrasion today and I do not believe you have pinkeye or allergic conjunctivitis.  You can use an over-the-counter eyedrop like blink to help keep your eye lubricated and help with the dryness.  Please follow-up with [place] or primary care provider if symptoms persist or worsen despite this treatment.    ED Prescriptions   None    PDMP not reviewed this encounter.   Eulogio Bear, NP 07/17/22 [phone]

## 2022-08-15 ENCOUNTER — Telehealth: Payer: BC Managed Care – PPO | Admitting: Physician Assistant

## 2022-08-15 DIAGNOSIS — J329 Chronic sinusitis, unspecified: Secondary | ICD-10-CM | POA: Diagnosis not present

## 2022-08-15 DIAGNOSIS — B9689 Other specified bacterial agents as the cause of diseases classified elsewhere: Secondary | ICD-10-CM

## 2022-08-15 MED ORDER — AMOXICILLIN-POT CLAVULANATE 875-125 MG PO TABS: 1.0000 | ORAL_TABLET | Freq: Two times a day (BID) | ORAL | 0 refills | Status: DC

## 2022-08-15 NOTE — Progress Notes (Signed)
Virtual Visit Consent   Shawn Strickland, you are scheduled for a virtual visit with a [facility] provider today. Just as with appointments in the office, your consent must be obtained to participate. Your consent will be active for this visit and any virtual visit you may have with one of our providers in the next 365 days. If you have a MyChart account, a copy of this consent can be sent to you electronically.  As this is a virtual visit, video technology does not allow for your provider to perform a traditional examination. This may limit your provider's ability to fully assess your condition. If your provider identifies any concerns that need to be evaluated in person or the need to arrange testing (such as labs, EKG, etc.), we will make arrangements to do so. Although advances in technology are sophisticated, we cannot ensure that it will always work on either your end or our end. If the connection with a video visit is poor, the visit may have to be switched to a telephone visit. With either a video or telephone visit, we are not always able to ensure that we have a secure connection.  By engaging in this virtual visit, you consent to the provision of healthcare and authorize for your insurance to be billed (if applicable) for the services provided during this visit. Depending on your insurance coverage, you may receive a charge related to this service.  I need to obtain your verbal consent now. Are you willing to proceed with your visit today? Shawn Strickland has provided verbal consent on 08/15/2022 for a virtual visit (video or telephone). Tylene Fantasia Ward, PA-C  Date: 08/15/2022 7:31 PM  Virtual Visit via Video Note   I, Tylene Fantasia Ward, connected with  Shawn Strickland  ([id number], Sep 04, 1995) on 08/15/22 at  7:30 PM EDT by a video-enabled telemedicine application and verified that I am speaking with the correct person using two identifiers.  Location: Patient: Virtual Visit Location Patient:  Home Provider: Virtual Visit Location Provider: Home   I discussed the limitations of evaluation and management by telemedicine and the availability of in person appointments. The patient expressed understanding and agreed to proceed.    History of Present Illness: Shawn Strickland is a 27 y.o. who identifies as a male who was assigned male at birth, and is being seen today for congestion, sinus pressure and pain that started about two weeks. Reports cough is worse at night.  He denies fever, wheezing, shortness of breath.  He has taken otc allergy medication with minimal relief.   HPI: HPI  Problems:  Patient Active Problem List   Diagnosis Date Noted   Facial fracture 12/14/2019    Allergies: No Known Allergies Medications:  Current Outpatient Medications:    amoxicillin-clavulanate (AUGMENTIN) 875-125 MG tablet, Take 1 tablet by mouth 2 (two) times daily., Disp: 20 tablet, Rfl: 0   acetaminophen (TYLENOL) 500 MG tablet, Take 2 tablets (1,000 mg total) by mouth every 6 (six) hours as needed. (Patient not taking: Reported on 02/10/2020), Disp: , Rfl:    promethazine-dextromethorphan (PROMETHAZINE-DM) 6.25-15 MG/5ML syrup, Take 5 mLs by mouth 4 (four) times daily as needed for cough., Disp: 118 mL, Rfl: 0  Observations/Objective: Patient is well-developed, well-nourished in no acute distress.  Resting comfortably at home.  Head is normocephalic, atraumatic.  No labored breathing.  Speech is clear and coherent with logical content.  Patient is alert and oriented at baseline.    Assessment and Plan: 1.  Sinusitis, bacterial - amoxicillin-clavulanate (AUGMENTIN) 875-125 MG tablet; Take 1 tablet by mouth 2 (two) times daily.  Dispense: 20 tablet; Refill: 0  Supportive care discussed. In person evaluation precautions discussed.   Follow Up Instructions: I discussed the assessment and treatment plan with the patient. The patient was provided an opportunity to ask questions and all were  answered. The patient agreed with the plan and demonstrated an understanding of the instructions.  A copy of instructions were sent to the patient via MyChart unless otherwise noted below.     The patient was advised to call back or seek an in-person evaluation if the symptoms worsen or if the condition fails to improve as anticipated.  Time:  I spent 8 minutes with the patient via telehealth technology discussing the above problems/concerns.    Tylene Fantasia Ward, PA-C

## 2022-08-15 NOTE — Patient Instructions (Signed)
  Lajuana Ripple Skellenger, thank you for joining Tylene Fantasia Ward, PA-C for today's virtual visit.  While this provider is not your primary care provider (PCP), if your PCP is located in our provider database this encounter information will be shared with them immediately following your visit.   A [facility] MyChart account gives you access to today's visit and all your visits, tests, and labs performed at [facility] " click here if you don't have a [facility] MyChart account or go to [email or web address]  Consent: (Patient) Shawn Strickland provided verbal consent for this virtual visit at the beginning of the encounter.  Current Medications:  Current Outpatient Medications:    amoxicillin-clavulanate (AUGMENTIN) 875-125 MG tablet, Take 1 tablet by mouth 2 (two) times daily., Disp: 20 tablet, Rfl: 0   acetaminophen (TYLENOL) 500 MG tablet, Take 2 tablets (1,000 mg total) by mouth every 6 (six) hours as needed. (Patient not taking: Reported on 02/10/2020), Disp: , Rfl:    promethazine-dextromethorphan (PROMETHAZINE-DM) 6.25-15 MG/5ML syrup, Take 5 mLs by mouth 4 (four) times daily as needed for cough., Disp: 118 mL, Rfl: 0   Medications ordered in this encounter:  Meds ordered this encounter  Medications   amoxicillin-clavulanate (AUGMENTIN) 875-125 MG tablet    Sig: Take 1 tablet by mouth 2 (two) times daily.    Dispense:  20 tablet    Refill:  0    Order Specific Question:   Supervising Provider    Answer:   Merrilee Jansky [id number]     *If you need refills on other medications prior to your next appointment, please contact your pharmacy*  Follow-Up: Call back or seek an in-person evaluation if the symptoms worsen or if the condition fails to improve as anticipated.  [facility] [phone]  Other Instructions Take antibiotic as prescribed.  Recommend Mucinex and Flonase for congestion. Recommend Ibuprofen or Tylenol as needed for pain. If no improvement  follow up for in person evaluation with PCP or Urgent Care.    If you have been instructed to have an in-person evaluation today at a local Urgent Care facility, please use the link below. It will take you to a list of all of our available [facility], including address, phone number and hours of operation. Please do not delay care.  [facility]  If you or a family member do not have a primary care provider, use the link below to schedule a visit and establish care. When you choose a [facility] primary care physician or advanced practice provider, you gain a long-term partner in health. Find a Primary Care Provider  Learn more about [facility]'s in-office and virtual care options: [facility] - Get Care Now

## 2023-06-21 ENCOUNTER — Ambulatory Visit: Payer: Self-pay

## 2023-06-21 ENCOUNTER — Telehealth: Payer: 59 | Admitting: Family Medicine

## 2023-06-21 DIAGNOSIS — R6889 Other general symptoms and signs: Secondary | ICD-10-CM | POA: Diagnosis not present

## 2023-06-21 MED ORDER — PSEUDOEPH-BROMPHEN-DM 30-2-10 MG/5ML PO SYRP: 5.0000 mL | ORAL_SOLUTION | Freq: Four times a day (QID) | ORAL | 0 refills | Status: DC | PRN

## 2023-06-21 MED ORDER — OSELTAMIVIR PHOSPHATE 75 MG PO CAPS
75.0000 mg | ORAL_CAPSULE | Freq: Two times a day (BID) | ORAL | 0 refills | Status: AC
Start: 2023-06-21 — End: 2023-06-26

## 2023-06-21 NOTE — Patient Instructions (Signed)
  Shawn Strickland, thank you for joining Freddy Finner, NP for today's virtual visit.  While this provider is not your primary care provider (PCP), if your PCP is located in our provider database this encounter information will be shared with them immediately following your visit.   A [facility] MyChart account gives you access to today's visit and all your visits, tests, and labs performed at [facility] " click here if you don't have a [facility] MyChart account or go to [email or web address]  Consent: (Patient) Shawn Strickland provided verbal consent for this virtual visit at the beginning of the encounter.  Current Medications:  Current Outpatient Medications:    brompheniramine-pseudoephedrine-DM 30-2-10 MG/5ML syrup, Take 5 mLs by mouth 4 (four) times daily as needed., Disp: 120 mL, Rfl: 0   oseltamivir (TAMIFLU) 75 MG capsule, Take 1 capsule (75 mg total) by mouth 2 (two) times daily for 5 days., Disp: 10 capsule, Rfl: 0   acetaminophen (TYLENOL) 500 MG tablet, Take 2 tablets (1,000 mg total) by mouth every 6 (six) hours as needed. (Patient not taking: Reported on 02/10/2020), Disp: , Rfl:    amoxicillin-clavulanate (AUGMENTIN) 875-125 MG tablet, Take 1 tablet by mouth 2 (two) times daily., Disp: 20 tablet, Rfl: 0   Medications ordered in this encounter:  Meds ordered this encounter  Medications   oseltamivir (TAMIFLU) 75 MG capsule    Sig: Take 1 capsule (75 mg total) by mouth 2 (two) times daily for 5 days.    Dispense:  10 capsule    Refill:  0    Supervising Provider:   Merrilee Jansky [[id number]]   brompheniramine-pseudoephedrine-DM 30-2-10 MG/5ML syrup    Sig: Take 5 mLs by mouth 4 (four) times daily as needed.    Dispense:  120 mL    Refill:  0    Supervising Provider:   Merrilee Jansky [[id number]]     *If you need refills on other medications prior to your next appointment, please contact your pharmacy*  Follow-Up: Call back or seek an in-person  evaluation if the symptoms worsen or if the condition fails to improve as anticipated.  [facility] [phone]  Other Instructions  - Continue OTC symptomatic management of choice  - Take prescribed medications as directed - Push fluids - Rest as needed      If you have been instructed to have an in-person evaluation today at a local Urgent Care facility, please use the link below. It will take you to a list of all of our available [facility], including address, phone number and hours of operation. Please do not delay care.  [facility]  If you or a family member do not have a primary care provider, use the link below to schedule a visit and establish care. When you choose a [facility] primary care physician or advanced practice provider, you gain a long-term partner in health. Find a Primary Care Provider  Learn more about [facility]'s in-office and virtual care options: [facility] - Get Care Now

## 2023-06-21 NOTE — Progress Notes (Signed)
 Virtual Visit Consent   Shawn Strickland, you are scheduled for a virtual visit with a [facility] provider today. Just as with appointments in the office, your consent must be obtained to participate. Your consent will be active for this visit and any virtual visit you may have with one of our providers in the next 365 days. If you have a MyChart account, a copy of this consent can be sent to you electronically.  As this is a virtual visit, video technology does not allow for your provider to perform a traditional examination. This may limit your provider's ability to fully assess your condition. If your provider identifies any concerns that need to be evaluated in person or the need to arrange testing (such as labs, EKG, etc.), we will make arrangements to do so. Although advances in technology are sophisticated, we cannot ensure that it will always work on either your end or our end. If the connection with a video visit is poor, the visit may have to be switched to a telephone visit. With either a video or telephone visit, we are not always able to ensure that we have a secure connection.  By engaging in this virtual visit, you consent to the provision of healthcare and authorize for your insurance to be billed (if applicable) for the services provided during this visit. Depending on your insurance coverage, you may receive a charge related to this service.  I need to obtain your verbal consent now. Are you willing to proceed with your visit today? Shawn Strickland has provided verbal consent on 06/21/2023 for a virtual visit (video or telephone). Freddy Finner, NP  Date: 06/21/2023 11:04 AM   Virtual Visit via Video Note   I, Freddy Finner, connected with  Shawn Strickland  ([id number], 1996-04-05, 28) on 06/21/23 at 11:00 AM EST by a video-enabled telemedicine application and verified that I am speaking with the correct person using two identifiers.  Location: Patient: Virtual Visit Location Patient:  Home Provider: Virtual Visit Location Provider: Home Office   I discussed the limitations of evaluation and management by telemedicine and the availability of in person appointments. The patient expressed understanding and agreed to proceed.    History of Present Illness: Shawn Strickland is a 28 y.o. who identifies as a male who was assigned male at birth, and is being seen today for flu like symptoms  Onset was 2 days ago- and worsened in last 24 hours - headache  Associated symptoms are cough - white and green mucus and congestion, chills, but no temp that is known  Modifying factors are ny quil and day quil  Denies chest pain, shortness of breath, fevers  Exposure to sick contacts- unknown Vaccine- Not up to date on Flu   Problems:  Patient Active Problem List   Diagnosis Date Noted   Facial fracture ([facility]) 12/14/2019    Allergies: No Known Allergies Medications:  Current Outpatient Medications:    acetaminophen (TYLENOL) 500 MG tablet, Take 2 tablets (1,000 mg total) by mouth every 6 (six) hours as needed. (Patient not taking: Reported on 02/10/2020), Disp: , Rfl:    amoxicillin-clavulanate (AUGMENTIN) 875-125 MG tablet, Take 1 tablet by mouth 2 (two) times daily., Disp: 20 tablet, Rfl: 0   promethazine-dextromethorphan (PROMETHAZINE-DM) 6.25-15 MG/5ML syrup, Take 5 mLs by mouth 4 (four) times daily as needed for cough., Disp: 118 mL, Rfl: 0  Observations/Objective: Patient is well-developed, well-nourished in no acute distress.  Resting comfortably  at home.  Head is normocephalic, atraumatic.  No labored breathing.  Speech is clear and coherent with logical content.  Patient is alert and oriented at baseline.    Assessment and Plan:   1. Flu-like symptoms (Primary)  - oseltamivir (TAMIFLU) 75 MG capsule; Take 1 capsule (75 mg total) by mouth 2 (two) times daily for 5 days.  Dispense: 10 capsule; Refill: 0 - brompheniramine-pseudoephedrine-DM 30-2-10 MG/5ML syrup; Take 5  mLs by mouth 4 (four) times daily as needed.  Dispense: 120 mL; Refill: 0  - Continue OTC symptomatic management of choice  - Take prescribed medications as directed - Push fluids - Rest as needed - Discussed return precautions and when to seek in-person evaluation, sent via AVS as well  Reviewed side effects, risks and benefits of medication.    Patient acknowledged agreement and understanding of the plan.   Past Medical, Surgical, Social History, Allergies, and Medications have been Reviewed.    Follow Up Instructions: I discussed the assessment and treatment plan with the patient. The patient was provided an opportunity to ask questions and all were answered. The patient agreed with the plan and demonstrated an understanding of the instructions.  A copy of instructions were sent to the patient via MyChart unless otherwise noted below.    The patient was advised to call back or seek an in-person evaluation if the symptoms worsen or if the condition fails to improve as anticipated.    Freddy Finner, NP

## 2023-07-02 ENCOUNTER — Telehealth: Admitting: Physician Assistant

## 2023-07-02 DIAGNOSIS — J020 Streptococcal pharyngitis: Secondary | ICD-10-CM

## 2023-07-02 MED ORDER — AMOXICILLIN 500 MG PO CAPS: 500.0000 mg | ORAL_CAPSULE | Freq: Two times a day (BID) | ORAL | 0 refills | Status: AC

## 2023-07-02 NOTE — Progress Notes (Signed)

## 2023-09-13 DIAGNOSIS — A499 Bacterial infection, unspecified: Secondary | ICD-10-CM | POA: Diagnosis not present

## 2023-11-22 ENCOUNTER — Ambulatory Visit (HOSPITAL_COMMUNITY)
Admission: RE | Admit: 2023-11-22 | Discharge: 2023-11-22 | Disposition: A | Discharge status: Home / Self Care | Source: Ambulatory Visit | Attending: Emergency Medicine | Admitting: Emergency Medicine

## 2023-11-22 ENCOUNTER — Encounter (HOSPITAL_COMMUNITY): Payer: Self-pay

## 2023-11-22 VITALS — BP 124/72 | HR 73 | Temp 98.7°F | Resp 16

## 2023-11-22 DIAGNOSIS — R194 Change in bowel habit: Secondary | ICD-10-CM | POA: Insufficient documentation

## 2023-11-22 DIAGNOSIS — J392 Other diseases of pharynx: Secondary | ICD-10-CM | POA: Insufficient documentation

## 2023-11-22 DIAGNOSIS — R5383 Other fatigue: Secondary | ICD-10-CM | POA: Insufficient documentation

## 2023-11-22 DIAGNOSIS — N50819 Testicular pain, unspecified: Secondary | ICD-10-CM | POA: Diagnosis not present

## 2023-11-22 DIAGNOSIS — Z113 Encounter for screening for infections with a predominantly sexual mode of transmission: Secondary | ICD-10-CM | POA: Insufficient documentation

## 2023-11-22 LAB — POCT URINALYSIS DIP (MANUAL ENTRY)
Bilirubin, UA: NEGATIVE
Blood, UA: NEGATIVE
Glucose, UA: NEGATIVE mg/dL
Ketones, POC UA: NEGATIVE mg/dL
Leukocytes, UA: NEGATIVE
Nitrite, UA: NEGATIVE
Protein Ur, POC: NEGATIVE mg/dL
Spec Grav, UA: 1.02 (ref 1.010–1.025)
Urobilinogen, UA: 0.2 U/dL
pH, UA: 7 (ref 5.0–8.0)

## 2023-11-22 LAB — POCT FASTING CBG KUC MANUAL ENTRY: POCT Glucose (KUC): 82 mg/dL (ref 70–99)

## 2023-11-22 NOTE — ED Provider Notes (Signed)
 [facility]    CSN: [id number] Arrival date & time: 11/22/23  1721      History   Chief Complaint Chief Complaint  Patient presents with   Fatigue    Fatigued, lethargic, dry mouth, irregular bowel movements, some groin pain - Entered by patient    HPI Shawn Strickland is a 28 y.o. male.  Last weekend was around a lot of people and drinking Woke Sunday initially thinking he was feeling hungover However had some other symptoms over the last 4 days   Dry throat, not painful to swallow. Has been hydrating.  No congestion or cough No fever or chills  Reports alternating between diarrhea and constipation No abdominal pain. No pain or straining with BM. No blood.  Also about 3 days of some mild left testicular discomfort not necessarily pain. Comes and goes. Does not hurt with touch, sitting, walking. He's not sure of any alleviating or aggravating factors. Monogamous with wife but still would like STD testing to rule out Denies any dysuria, hematuria, urgency/frequency  Denies rash, redness, skin changes, discharge   History reviewed. No pertinent past medical history.  Patient Active Problem List   Diagnosis Date Noted   Facial fracture (HCC) 12/14/2019    Past Surgical History:  Procedure Laterality Date   APPLICATION OF A-CELL OF EXTREMITY Bilateral 12/19/2019   Procedure: APPLICATION OF ACELL;  Surgeon: Elisabeth Craig RAMAN, MD;  Location: [facility] OR;  Service: Plastics;  Laterality: Bilateral;   EXTERNAL FIXATION ARM Right 12/15/2019   Procedure: REDUCTION AND EXTERNAL FIXATION RIGHT WRIST;  Surgeon: Sebastian Lenis, MD;  Location: [facility] OR;  Service: Orthopedics;  Laterality: Right;   I & D EXTREMITY Bilateral 12/19/2019   Procedure: IRRIGATION AND DEBRIDEMENT EXTREMITY BILATERAL ARMS AND LEGS;  Surgeon: Elisabeth Craig RAMAN, MD;  Location: [facility] OR;  Service: Plastics;  Laterality: Bilateral;   INCISION AND DRAINAGE OF WOUND N/A 12/15/2019   Procedure: IRRIGATION AND DEBRIDEMENT  OF BILATERAL LOWER EXTREMITIES, BILATERAL UPPER EXTREMITIES,  AND ABDOMINAL WALL;  Surgeon: Paola Dreama SAILOR, MD;  Location: [facility] OR;  Service: General;  Laterality: N/A;   INCISION AND DRAINAGE OF WOUND N/A 12/19/2019   Procedure: IRRIGATION AND DEBRIDEMENT ABDOMEN;  Surgeon: Elisabeth Craig RAMAN, MD;  Location: [facility] OR;  Service: Plastics;  Laterality: N/A;       Home Medications    Prior to Admission medications   Not on File    Family History History reviewed. No pertinent family history.  Social History Social History   Tobacco Use   Smoking status: Never   Smokeless tobacco: Never  Vaping Use   Vaping status: Never Used  Substance Use Topics   Alcohol use: Yes    Alcohol/week: 2.0 standard drinks of alcohol    Types: 2 Standard drinks or equivalent per week    Comment: Occasionally   Drug use: Never     Allergies   Patient has no known allergies.   Review of Systems Review of Systems As per HPI  Physical Exam Triage Vital Signs ED Triage Vitals  Encounter Vitals Group     BP 11/22/23 1740 124/72     Girls Systolic BP Percentile --      Girls Diastolic BP Percentile --      Boys Systolic BP Percentile --      Boys Diastolic BP Percentile --      Pulse Rate 11/22/23 1740 73     Resp 11/22/23 1740 16     Temp 11/22/23 1740  98.7 F (37.1 C)     Temp Source 11/22/23 1740 Oral     SpO2 11/22/23 1740 97 %     Weight --      Height --      Head Circumference --      Peak Flow --      Pain Score 11/22/23 1739 4     Pain Loc --      Pain Education --      Exclude from Growth Chart --    No data found.  Updated Vital Signs BP 124/72 (BP Location: Right Arm)   Pulse 73   Temp 98.7 F (37.1 C) (Oral)   Resp 16   SpO2 97%   Physical Exam Vitals and nursing note reviewed.  Constitutional:      General: He is not in acute distress.    Appearance: Normal appearance. He is not ill-appearing.  HENT:     Right Ear: Tympanic membrane and ear canal normal.      Left Ear: Tympanic membrane and ear canal normal.     Nose: Nose normal.     Mouth/Throat:     Mouth: Mucous membranes are moist.     Pharynx: Oropharynx is clear. No oropharyngeal exudate, posterior oropharyngeal erythema or uvula swelling.     Tonsils: 0 on the right. 0 on the left.  Eyes:     Conjunctiva/sclera: Conjunctivae normal.  Cardiovascular:     Rate and Rhythm: Normal rate and regular rhythm.     Pulses: Normal pulses.     Heart sounds: Normal heart sounds.  Pulmonary:     Effort: Pulmonary effort is normal.     Breath sounds: Normal breath sounds.  Abdominal:     General: Bowel sounds are normal.     Palpations: Abdomen is soft.     Tenderness: There is no abdominal tenderness. There is no right CVA tenderness, left CVA tenderness, guarding or rebound.  Genitourinary:    Comments: Deferred  Musculoskeletal:        General: Normal range of motion.     Cervical back: Normal range of motion. No rigidity.  Lymphadenopathy:     Cervical: No cervical adenopathy.  Skin:    General: Skin is warm and dry.  Neurological:     Mental Status: He is alert and oriented to person, place, and time.      UC Treatments / Results  Labs (all labs ordered are listed, but only abnormal results are displayed) Labs Reviewed  POCT FASTING CBG [facility] MANUAL ENTRY  POCT URINALYSIS DIP (MANUAL ENTRY)  CYTOLOGY, (ORAL, ANAL, URETHRAL) ANCILLARY ONLY    EKG  Radiology No results found.  Procedures Procedures   Medications Ordered in UC Medications - No data to display  Initial Impression / Assessment and Plan / UC Course  I have reviewed the triage vital signs and the nursing notes.  Pertinent labs & imaging results that were available during my care of the patient were reviewed by me and considered in my medical decision making (see chart for details).  Overall vague symptoms. Stable vitals, afebrile UA unremarkable CBG 82 Cytology swab is pending.  Reassurance provided  no concerning red flags. There is nothing specific I would be concerned about at this time. Patient states he may be worrying about the symptoms causing them to seem more than they are. I would agree with this; I have recommended monitoring over the next few days for improvement. Again reassurance is provided.   He  does not have any testicular red flags either. This symptom is also very vague. We have discussed strict ED precautions for any acute testicular pain. We discussed torsion, hydrocele, varicocele, trauma.   Has not seen a PCP in a while Staff has scheduled him for new visit, 2 weeks from now on 8/13 Recommend follow up with them. ED precautions. Agrees to plan, all questions answered  Final Clinical Impressions(s) / UC Diagnoses   Final diagnoses:  Other fatigue  Dry throat  Change in bowel habits  Testicular discomfort     Discharge Instructions      Please follow with your new primary care provider regarding symptoms You don't have any concerning symptoms as of now! Monitor for the next few days  Please go to the emergency department if symptoms worsen or become severe.     ED Prescriptions   None    PDMP not reviewed this encounter.   Jeryl Asberry RIGGERS 11/22/23 1939

## 2023-11-22 NOTE — Discharge Instructions (Addendum)
 Please follow with your new primary care provider regarding symptoms You don't have any concerning symptoms as of now! Monitor for the next few days  Please go to the emergency department if symptoms worsen or become severe.
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
## 2023-11-23 LAB — CYTOLOGY, (ORAL, ANAL, URETHRAL) ANCILLARY ONLY
Chlamydia: NEGATIVE
Comment: NEGATIVE
Comment: NEGATIVE
Comment: NORMAL
Neisseria Gonorrhea: NEGATIVE
Trichomonas: NEGATIVE

## 2023-12-05 ENCOUNTER — Ambulatory Visit: Admitting: General Practice

## 2023-12-05 ENCOUNTER — Ambulatory Visit: Payer: Self-pay | Admitting: General Practice

## 2023-12-05 ENCOUNTER — Encounter: Payer: Self-pay | Admitting: General Practice

## 2023-12-05 VITALS — BP 118/52 | HR 79 | Temp 98.7°F | Ht 68.0 in | Wt 201.0 lb

## 2023-12-05 DIAGNOSIS — Z1159 Encounter for screening for other viral diseases: Secondary | ICD-10-CM

## 2023-12-05 DIAGNOSIS — Z131 Encounter for screening for diabetes mellitus: Secondary | ICD-10-CM | POA: Diagnosis not present

## 2023-12-05 DIAGNOSIS — E66811 Obesity, class 1: Secondary | ICD-10-CM | POA: Diagnosis not present

## 2023-12-05 DIAGNOSIS — Z0000 Encounter for general adult medical examination without abnormal findings: Secondary | ICD-10-CM | POA: Diagnosis not present

## 2023-12-05 DIAGNOSIS — Z7689 Persons encountering health services in other specified circumstances: Secondary | ICD-10-CM | POA: Insufficient documentation

## 2023-12-05 DIAGNOSIS — E6609 Other obesity due to excess calories: Secondary | ICD-10-CM | POA: Diagnosis not present

## 2023-12-05 DIAGNOSIS — Z6830 Body mass index (BMI) 30.0-30.9, adult: Secondary | ICD-10-CM

## 2023-12-05 LAB — LIPID PANEL
Cholesterol: 164 mg/dL (ref 0–200)
HDL: 35.7 mg/dL — ABNORMAL LOW (ref >39.00)
LDL Cholesterol: 96 mg/dL (ref 0–99)
NonHDL: 127.83
Total CHOL/HDL Ratio: 5
Triglycerides: 161 mg/dL — ABNORMAL HIGH (ref 0.0–149.0)
VLDL: 32.2 mg/dL (ref 0.0–40.0)

## 2023-12-05 LAB — COMPREHENSIVE METABOLIC PANEL WITH GFR
ALT: 56 U/L — ABNORMAL HIGH (ref 0–53)
AST: 32 U/L (ref 0–37)
Albumin: 4.7 g/dL (ref 3.5–5.2)
Alkaline Phosphatase: 71 U/L (ref 39–117)
BUN: 16 mg/dL (ref 6–23)
CO2: 28 meq/L (ref 19–32)
Calcium: 9.5 mg/dL (ref 8.4–10.5)
Chloride: 105 meq/L (ref 96–112)
Creatinine, Ser: 0.84 mg/dL (ref 0.40–1.50)
GFR: 118.76 mL/min (ref >60.00)
Glucose, Bld: 93 mg/dL (ref 70–99)
Potassium: 4.3 meq/L (ref 3.5–5.1)
Sodium: 141 meq/L (ref 135–145)
Total Bilirubin: 0.4 mg/dL (ref 0.2–1.2)
Total Protein: 7.4 g/dL (ref 6.0–8.3)

## 2023-12-05 LAB — CBC
HCT: 45.5 % (ref 39.0–52.0)
Hemoglobin: 15 g/dL (ref 13.0–17.0)
MCHC: 33 g/dL (ref 30.0–36.0)
MCV: 86.6 fl (ref 78.0–100.0)
Platelets: 247 K/uL (ref 150.0–400.0)
RBC: 5.25 Mil/uL (ref 4.22–5.81)
RDW: 13.9 % (ref 11.5–15.5)
WBC: 4.6 K/uL (ref 4.0–10.5)

## 2023-12-05 LAB — TSH: TSH: 1.95 u[IU]/mL (ref 0.35–5.50)

## 2023-12-05 LAB — HEMOGLOBIN A1C: Hgb A1c MFr Bld: 4.8 % (ref 4.6–6.5)

## 2023-12-05 NOTE — Assessment & Plan Note (Signed)
Immunizations UTD.  Discussed the importance of a healthy diet and regular exercise in order for weight loss, and to reduce the risk of further co-morbidity.  Exam stable. Labs pending.  Follow up in 1 year for repeat physical.  

## 2023-12-05 NOTE — Assessment & Plan Note (Signed)
 Discussed the importance of healthy diet and exercise to affect sustainable weight loss.

## 2023-12-05 NOTE — Patient Instructions (Signed)
 Stop by the lab prior to leaving today. I will notify you of your results once received.   Let me know if you want to discuss dermatology referral.   It was a pleasure to meet you today! Please don't hesitate to contact me with any questions. Welcome to [facility]!

## 2023-12-05 NOTE — Assessment & Plan Note (Signed)
 EMR reviewed briefly.

## 2023-12-05 NOTE — Progress Notes (Signed)
 New Patient Office Visit  Subjective    Patient ID: Shawn Strickland, male    DOB: [birth year]  Age: 28 y.o. MRN: [id number]  CC:  Chief Complaint  Patient presents with   New Patient (Initial Visit)    HPI Shawn Strickland is a 28 y.o. male presents to establish care, for complete physical and follow up of chronic conditions.  Previous PCP, physical or labs: many years.   Immunizations: -Tetanus: Completed in 2017.  Diet: Fair diet.  Exercise: regular exercise. Weight lifting and cardio 5 days a week 1 hr to 1:15 hr.   Eye exam: never completed. Dental exam: Completes semi-annually    No known hx of breast cancer, colon cancer, heart disease or stroke.  Father: hypertension Mother: hyperlipidemia Diabetes- maternal uncle.   No outpatient encounter medications on file as of 12/05/2023.   No facility-administered encounter medications on file as of 12/05/2023.    Past Medical History:  Diagnosis Date   Arthritis    Blood in stool     Past Surgical History:  Procedure Laterality Date   APPLICATION OF A-CELL OF EXTREMITY Bilateral 12/19/2019   Procedure: APPLICATION OF ACELL;  Surgeon: Elisabeth Craig RAMAN, MD;  Location: [facility];  Service: Plastics;  Laterality: Bilateral;   EXTERNAL FIXATION ARM Right 12/15/2019   Procedure: REDUCTION AND EXTERNAL FIXATION RIGHT WRIST;  Surgeon: Sebastian Lenis, MD;  Location: [facility] OR;  Service: Orthopedics;  Laterality: Right;   I & D EXTREMITY Bilateral 12/19/2019   Procedure: IRRIGATION AND DEBRIDEMENT EXTREMITY BILATERAL ARMS AND LEGS;  Surgeon: Elisabeth Craig RAMAN, MD;  Location: [facility];  Service: Plastics;  Laterality: Bilateral;   INCISION AND DRAINAGE OF WOUND N/A 12/15/2019   Procedure: IRRIGATION AND DEBRIDEMENT OF BILATERAL LOWER EXTREMITIES, BILATERAL UPPER EXTREMITIES,  AND ABDOMINAL WALL;  Surgeon: Paola Dreama SAILOR, MD;  Location: [facility];  Service: General;  Laterality: N/A;   INCISION AND DRAINAGE OF WOUND N/A 12/19/2019   Procedure:  IRRIGATION AND DEBRIDEMENT ABDOMEN;  Surgeon: Elisabeth Craig RAMAN, MD;  Location: [facility];  Service: Plastics;  Laterality: N/A;    History reviewed. No pertinent family history.  Social History   Socioeconomic History   Marital status: Married    Spouse name: morgan   Number of children: Not on file   Years of education: Not on file   Highest education level: Not on file  Occupational History   Not on file  Tobacco Use   Smoking status: Never   Smokeless tobacco: Never  Vaping Use   Vaping status: Every Day   Substances: Nicotine  Substance and Sexual Activity   Alcohol use: Yes    Alcohol/week: 2.0 standard drinks of alcohol    Types: 2 Standard drinks or equivalent per week    Comment: Occasionally   Drug use: Never   Sexual activity: Yes  Other Topics Concern   Not on file  Social History Narrative   ** Merged History Encounter **       Social Drivers of Corporate investment banker Strain: Not on file  Food Insecurity: Not on file  Transportation Needs: Not on file  Physical Activity: Not on file  Stress: Not on file  Social Connections: Not on file  Intimate Partner Violence: Not on file    Review of Systems  Constitutional:  Negative for chills, fever, malaise/fatigue and weight loss.  HENT:  Negative for congestion, ear discharge, ear pain, hearing loss, nosebleeds, sinus pain, sore throat and tinnitus.  Eyes:  Negative for blurred vision, double vision, pain, discharge and redness.  Respiratory:  Negative for cough, shortness of breath, wheezing and stridor.   Cardiovascular:  Negative for chest pain, palpitations and leg swelling.  Gastrointestinal:  Negative for abdominal pain, constipation, diarrhea, heartburn, nausea and vomiting.  Genitourinary:  Negative for dysuria, frequency and urgency.  Musculoskeletal:  Negative for myalgias.  Skin:  Negative for rash.  Neurological:  Negative for dizziness, tingling, seizures, weakness and headaches.   Psychiatric/Behavioral:  Negative for depression, substance abuse and suicidal ideas. The patient is not nervous/anxious.         Objective    BP (!) 118/52   Pulse 79   Temp 98.7 F (37.1 C) (Temporal)   Ht 5' 8 (1.727 m)   Wt 201 lb (91.2 kg)   SpO2 98%   BMI 30.56 kg/m   Physical Exam Vitals and nursing note reviewed.  Constitutional:      Appearance: Normal appearance.  HENT:     Head: Normocephalic and atraumatic.     Right Ear: Tympanic membrane, ear canal and external ear normal.     Left Ear: Tympanic membrane, ear canal and external ear normal.     Nose: Nose normal.     Mouth/Throat:     Mouth: Mucous membranes are moist.     Pharynx: Oropharynx is clear.  Eyes:     Conjunctiva/sclera: Conjunctivae normal.     Pupils: Pupils are equal, round, and reactive to light.  Cardiovascular:     Rate and Rhythm: Normal rate and regular rhythm.     Pulses: Normal pulses.     Heart sounds: Normal heart sounds.  Pulmonary:     Effort: Pulmonary effort is normal.     Breath sounds: Normal breath sounds.  Abdominal:     General: Abdomen is flat. Bowel sounds are normal.     Palpations: Abdomen is soft.  Musculoskeletal:        General: Normal range of motion.     Cervical back: Normal range of motion.  Skin:    General: Skin is warm and dry.     Capillary Refill: Capillary refill takes less than 2 seconds.  Neurological:     General: No focal deficit present.     Mental Status: He is alert and oriented to person, place, and time. Mental status is at baseline.  Psychiatric:        Mood and Affect: Mood normal.        Behavior: Behavior normal.        Thought Content: Thought content normal.        Judgment: Judgment normal.         Assessment & Plan:  Encounter for screening and preventative care Assessment & Plan: Immunizations UTD.  Discussed the importance of a healthy diet and regular exercise in order for weight loss, and to reduce the risk of  further co-morbidity.  Exam stable. Labs pending.  Follow up in 1 year for repeat physical.   Orders: -     CBC -     Comprehensive metabolic panel with GFR -     Lipid panel -     Hemoglobin A1c -     TSH  Establishing care with new doctor, encounter for Assessment & Plan: EMR reviewed briefly.    Class 1 obesity due to excess calories with body mass index (BMI) of 30.0 to 30.9 in adult, unspecified whether serious comorbidity present Assessment & Plan: Discussed the  importance of healthy diet and exercise to affect sustainable weight loss.    Orders: -     CBC -     Comprehensive metabolic panel with GFR -     Lipid panel -     Hemoglobin A1c -     TSH  Need for hepatitis C screening test -     Hepatitis C antibody     Return in about 1 year (around 12/04/2024) for physical.   Carrol Aurora, NP

## 2023-12-06 LAB — HEPATITIS C ANTIBODY: Hepatitis C Ab: NONREACTIVE

## 2023-12-20 ENCOUNTER — Encounter: Payer: Self-pay | Admitting: General Practice

## 2023-12-20 ENCOUNTER — Ambulatory Visit: Admitting: General Practice

## 2023-12-20 VITALS — BP 128/70 | HR 62 | Temp 98.0°F | Ht 68.0 in | Wt 196.0 lb

## 2023-12-20 DIAGNOSIS — K120 Recurrent oral aphthae: Secondary | ICD-10-CM | POA: Diagnosis not present

## 2023-12-20 DIAGNOSIS — K1370 Unspecified lesions of oral mucosa: Secondary | ICD-10-CM

## 2023-12-20 DIAGNOSIS — L308 Other specified dermatitis: Secondary | ICD-10-CM

## 2023-12-20 MED ORDER — NYSTATIN 100000 UNIT/ML MT SUSP: 5.0000 mL | Freq: Four times a day (QID) | OROMUCOSAL | 0 refills | Status: DC

## 2023-12-20 MED ORDER — NYSTATIN 100000 UNIT/GM EX CREA: 1.0000 | TOPICAL_CREAM | Freq: Two times a day (BID) | CUTANEOUS | 0 refills | Status: DC

## 2023-12-20 NOTE — Patient Instructions (Signed)
 Start Magic mouthwash.  Start nystatin  cream.   F/u as needed.   It was a pleasure to see you today!
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
## 2023-12-25 ENCOUNTER — Ambulatory Visit: Payer: Self-pay | Admitting: General Practice

## 2023-12-25 LAB — HERPES SIMPLEX VIRUS CULTURE
MICRO NUMBER:: 16896723
SPECIMEN QUALITY:: ADEQUATE

## 2024-01-03 ENCOUNTER — Ambulatory Visit: Admitting: General Practice

## 2024-01-03 ENCOUNTER — Encounter: Payer: Self-pay | Admitting: General Practice

## 2024-01-03 VITALS — BP 122/60 | HR 59 | Temp 98.4°F | Ht 68.0 in | Wt 196.0 lb

## 2024-01-03 DIAGNOSIS — R21 Rash and other nonspecific skin eruption: Secondary | ICD-10-CM | POA: Insufficient documentation

## 2024-01-03 MED ORDER — HYDROXYZINE HCL 10 MG PO TABS: 10.0000 mg | ORAL_TABLET | Freq: Three times a day (TID) | ORAL | 0 refills | Status: DC | PRN

## 2024-01-03 NOTE — Assessment & Plan Note (Signed)
 Appears to be secondary to dry skin.  Has failed multiple treatments and OTC antihistamine.   Rx sent for Hydroxyzine  10 mg TID PRN. Sedation precautions discussed.   Referral placed for dermatology.

## 2024-01-03 NOTE — Progress Notes (Addendum)
 Established Patient Office Visit  Subjective   Patient ID: Shawn Strickland, male    DOB: 1995/07/12  Age: 28 y.o. MRN: [id number]  Chief Complaint  Patient presents with   Rash    Spreading to legs and trunk; rash in groin better from using nystatin . Patient tried using the cream on the other areas and that made no difference. Patient states that the new areas are very painful and makes it very uncomfortable to sit; states it feels like he is sitting on glass.     Rash Pertinent negatives include no diarrhea, fever, shortness of breath or vomiting.    Shawn Strickland is a 28 year old male with past medical history of facial fracture, obesity presents today for an acute visit.   He was evaluated on 12/20/23 for canker sores in his mouth and was given magic mouthwash.   Discussed the use of AI scribe software for clinical note transcription with the patient, who gave verbal consent to proceed.  History of Present Illness Shawn Strickland is a 28 year old male who presents with a painful rash and red spots on his body.  He developed a painful rash characterized by red dots, primarily located on his sides and buttocks. The rash is painful, particularly when sitting, and has a burning sensation. The rash has been present for about a week, with the most recent spread to his sides occurring approximately two days ago.  He has a history of using nystatin  cream, which he suspects may have caused an allergic reaction. He stopped using the cream two to three days ago and has been trying to hydrate his skin, but these measures have not alleviated the symptoms.  About a week ago, he noticed red dots appearing all over his legs, which were itchy and have since mostly resolved. He took pictures of these spots, which were sporadic and not clustered. The red spots on his legs were itchy, unlike the current rash, which is painful.  No fever or systemic illness.    Patient Active Problem List   Diagnosis  Date Noted   Rash 01/03/2024   Establishing care with new doctor, encounter for 12/05/2023   Encounter for screening and preventative care 12/05/2023   Class 1 obesity due to excess calories with body mass index (BMI) of 30.0 to 30.9 in adult 12/05/2023   Facial fracture (HCC) 12/14/2019   Past Medical History:  Diagnosis Date   Arthritis    Blood in stool    Past Surgical History:  Procedure Laterality Date   APPLICATION OF A-CELL OF EXTREMITY Bilateral 12/19/2019   Procedure: APPLICATION OF ACELL;  Surgeon: Elisabeth Craig RAMAN, MD;  Location: [facility] OR;  Service: Plastics;  Laterality: Bilateral;   EXTERNAL FIXATION ARM Right 12/15/2019   Procedure: REDUCTION AND EXTERNAL FIXATION RIGHT WRIST;  Surgeon: Sebastian Lenis, MD;  Location: [facility] OR;  Service: Orthopedics;  Laterality: Right;   I & D EXTREMITY Bilateral 12/19/2019   Procedure: IRRIGATION AND DEBRIDEMENT EXTREMITY BILATERAL ARMS AND LEGS;  Surgeon: Elisabeth Craig RAMAN, MD;  Location: [facility] OR;  Service: Plastics;  Laterality: Bilateral;   INCISION AND DRAINAGE OF WOUND N/A 12/15/2019   Procedure: IRRIGATION AND DEBRIDEMENT OF BILATERAL LOWER EXTREMITIES, BILATERAL UPPER EXTREMITIES,  AND ABDOMINAL WALL;  Surgeon: Paola Dreama SAILOR, MD;  Location: [facility] OR;  Service: General;  Laterality: N/A;   INCISION AND DRAINAGE OF WOUND N/A 12/19/2019   Procedure: IRRIGATION AND DEBRIDEMENT ABDOMEN;  Surgeon: Elisabeth Craig RAMAN, MD;  Location:  [facility] OR;  Service: Plastics;  Laterality: N/A;   No Known Allergies       01/03/2024    9:57 AM 12/20/2023   12:03 PM 12/05/2023   10:07 AM  Depression screen PHQ 2/9  Decreased Interest 0 0 0  Down, Depressed, Hopeless 0 0 0  PHQ - 2 Score 0 0 0  Altered sleeping 0 0 0  Tired, decreased energy 0 0 1  Change in appetite 0 0 1  Feeling bad or failure about yourself  0 0 0  Trouble concentrating 0 0 0  Moving slowly or fidgety/restless 0 0 0  Suicidal thoughts 0 0 0  PHQ-9 Score 0 0 2  Difficult doing work/chores Not  difficult at all Not difficult at all Not difficult at all       01/03/2024    9:57 AM 12/20/2023   12:03 PM 12/05/2023   10:07 AM  GAD 7 : Generalized Anxiety Score  Nervous, Anxious, on Edge 0 0 1  Control/stop worrying 0 0 1  Worry too much - different things 0 1 0  Trouble relaxing 0 0 0  Restless 0 0 0  Easily annoyed or irritable 0 1 1  Afraid - awful might happen 0 0 0  Total GAD 7 Score 0 2 3  Anxiety Difficulty Not difficult at all Not difficult at all Not difficult at all      Review of Systems  Constitutional:  Negative for chills and fever.  Respiratory:  Negative for shortness of breath.   Cardiovascular:  Negative for chest pain.  Gastrointestinal:  Negative for abdominal pain, constipation, diarrhea, heartburn, nausea and vomiting.  Genitourinary:  Negative for dysuria, frequency and urgency.  Skin:  Positive for rash.  Neurological:  Negative for dizziness and headaches.  Endo/Heme/Allergies:  Negative for polydipsia.  Psychiatric/Behavioral:  Negative for depression and suicidal ideas. The patient is not nervous/anxious.       Objective:     BP 122/60   Pulse (!) 59   Temp 98.4 F (36.9 C) (Oral)   Ht 5' 8 (1.727 m)   Wt 196 lb (88.9 kg)   SpO2 99%   BMI 29.80 kg/m  BP Readings from Last 3 Encounters:  01/03/24 122/60  12/20/23 128/70  12/05/23 (!) 118/52   Wt Readings from Last 3 Encounters:  01/03/24 196 lb (88.9 kg)  12/20/23 196 lb (88.9 kg)  12/05/23 201 lb (91.2 kg)      Physical Exam Vitals and nursing note reviewed. Exam conducted with a chaperone present.  Constitutional:      Appearance: Normal appearance.  Cardiovascular:     Rate and Rhythm: Normal rate and regular rhythm.     Pulses: Normal pulses.     Heart sounds: Normal heart sounds.  Pulmonary:     Effort: Pulmonary effort is normal.     Breath sounds: Normal breath sounds.  Skin:    Findings: Rash present. No bruising or ecchymosis. Rash is not crusting or pustular.       Neurological:     Mental Status: He is alert and oriented to person, place, and time.  Psychiatric:        Mood and Affect: Mood normal.        Behavior: Behavior normal.        Thought Content: Thought content normal.        Judgment: Judgment normal.      No results found for any visits on 01/03/24.  The ASCVD Risk score (Arnett DK, et al., 2019) failed to calculate for the following reasons:   The 2019 ASCVD risk score is only valid for ages 19 to 55    Assessment & Plan:  Rash Assessment & Plan: Appears to be secondary to dry skin.  Has failed multiple treatments and OTC antihistamine.   Rx sent for Hydroxyzine  10 mg TID PRN. Sedation precautions discussed.   Referral placed for dermatology.   Orders: -     hydrOXYzine  HCl; Take 1 tablet (10 mg total) by mouth 3 (three) times daily as needed.  Dispense: 30 tablet; Refill: 0 -     Ambulatory referral to Dermatology     Return if symptoms worsen or fail to improve.    Carrol Aurora, NP

## 2024-01-03 NOTE — Patient Instructions (Signed)
 Apply vaseline twice daily.   You can use an over the counter antihistamine for itching and discomfort.   If you are not better, let me know via mychart and I can put in a referral to dermatology.   It was a pleasure to see you today!

## 2024-01-07 ENCOUNTER — Ambulatory Visit

## 2024-01-07 DIAGNOSIS — L739 Follicular disorder, unspecified: Secondary | ICD-10-CM

## 2024-01-07 DIAGNOSIS — R21 Rash and other nonspecific skin eruption: Secondary | ICD-10-CM | POA: Diagnosis not present

## 2024-01-07 MED ORDER — CLINDAMYCIN PHOSPHATE 1 % EX SWAB: 1.0000 | Freq: Every day | CUTANEOUS | 2 refills | Status: DC

## 2024-01-07 NOTE — Progress Notes (Signed)
    Subjective   Shawn Strickland is a 28 y.o. male who presents for the following: Rash. Patient is new patient  Today patient reports: Rash at buttocks, hips and upper thighs. Started about 3 weeks ago, burns. PCP gave him Nystatin  cream but no improvement. No new soaps, lotions. Patient advises rash was worse last night.   Review of Systems:    No other skin or systemic complaints except as noted in HPI or Assessment and Plan.  The following portions of the chart were reviewed this encounter and updated as appropriate: medications, allergies, medical history  Relevant Medical History:  n/a   Objective  Well appearing patient in no apparent distress; mood and affect are within normal limits. Examination was performed of the: Focused Exam of: buttocks, hips, thighs, legs   Examination notable for:  Buttocks and lateral legs with erythematous pink papules, occasional pustule  Examination limited by: n/a     Assessment & Plan   Erythematous papules/pustules of buttocks, legs - improving today with post inflammatory erythema - favor folliculitis vs less likely DH, LYP  Chronic and persistent condition with duration or expected duration over one year. Condition is symptomatic and bothersome to patient. Patient is flaring and not currently at treatment goal.  - Discussed diagnosis, typical course, and treatment options for this condition - Start topical clindamycin  swabs 1% once daily to active areas  - start benzoyl peroxide 5% wash in the morning. Educated patient about proper use and potential side effects, including dryness, irritation, and bleaching of fabrics. - discussed biopsy, culture if not resolving. Patient will RTC if needed.  Procedures, orders, diagnosis for this visit:  RASH AND OTHER NONSPECIFIC SKIN ERUPTION   FOLLICULITIS    Rash and other nonspecific skin eruption  Folliculitis  Other orders -     Clindamycin  Phosphate; Apply 1 Swab topically daily.   Dispense: 60 each; Refill: 2    Return to clinic: Return if symptoms worsen or fail to improve.  Documentation: I have reviewed the above documentation for accuracy and completeness, and I agree with the above.  Lauraine JAYSON Kanaris, MD

## 2024-01-07 NOTE — Progress Notes (Signed)
 Error - see other note

## 2024-01-07 NOTE — Patient Instructions (Addendum)
 benzoyl peroxide wash 3-5% (brand name includes Neutragena Clear Pore, CereVe Acne Foaming Cream Cleanser, Differin Cleanser) that you use in the shower. Can bleach linens (clothes, towels, etc), will NOT bleach your skin or hair). Dry off with a white towel so it doesn't take the color out of clothing and colored towels.     Due to recent changes in healthcare laws, you may see results of your pathology and/or laboratory studies on MyChart before the doctors have had a chance to review them. We understand that in some cases there may be results that are confusing or concerning to you. Please understand that not all results are received at the same time and often the doctors may need to interpret multiple results in order to provide you with the best plan of care or course of treatment. Therefore, we ask that you please give us  2 business days to thoroughly review all your results before contacting the office for clarification. Should we see a critical lab result, you will be contacted sooner.   If You Need Anything After Your Visit  If you have any questions or concerns for your doctor, please call our main line at [phone] and press option 4 to reach your doctor's medical assistant. If no one answers, please leave a voicemail as directed and we will return your call as soon as possible. Messages left after 4 pm will be answered the following business day.   You may also send us  a message via MyChart. We typically respond to MyChart messages within 1-2 business days.  For prescription refills, please ask your pharmacy to contact our office. Our fax number is [phone].  If you have an urgent issue when the clinic is closed that cannot wait until the next business day, you can page your doctor at the number below.    Please note that while we do our best to be available for urgent issues outside of office hours, we are not available 24/7.   If you have an urgent issue and are unable to  reach us , you may choose to seek medical care at your doctor's office, retail clinic, urgent care center, or emergency room.  If you have a medical emergency, please immediately call 911 or go to the emergency department.  Pager Numbers  - Dr. Hester: [phone]  - Dr. Jackquline: [phone]  - Dr. Claudene: [phone]   - Dr. Raymund: [phone]  In the event of inclement weather, please call our main line at [phone] for an update on the status of any delays or closures.  Dermatology Medication Tips: Please keep the boxes that topical medications come in in order to help keep track of the instructions about where and how to use these. Pharmacies typically print the medication instructions only on the boxes and not directly on the medication tubes.   If your medication is too expensive, please contact our office at [phone] option 4 or send us  a message through MyChart.   We are unable to tell what your co-pay for medications will be in advance as this is different depending on your insurance coverage. However, we may be able to find a substitute medication at lower cost or fill out paperwork to get insurance to cover a needed medication.   If a prior authorization is required to get your medication covered by your insurance company, please allow us  1-2 business days to complete this process.  Drug prices often vary depending on where the prescription is filled and some pharmacies may  offer cheaper prices.  The website www.goodrx.com contains coupons for medications through different pharmacies. The prices here do not account for what the cost may be with help from insurance (it may be cheaper with your insurance), but the website can give you the price if you did not use any insurance.  - You can print the associated coupon and take it with your prescription to the pharmacy.  - You may also stop by our office during regular business hours and pick up a GoodRx coupon card.  -  If you need your prescription sent electronically to a different pharmacy, notify our office through [facility] or by phone at [phone] option 4.     Si Usted Necesita Algo Despus de Su Visita  Tambin puede enviarnos un mensaje a travs de Clinical cytogeneticist. Por lo general respondemos a los mensajes de MyChart en el transcurso de 1 a 2 das hbiles.  Para renovar recetas, por favor pida a su farmacia que se ponga en contacto con nuestra oficina. Randi lakes de fax es [place] [phone].  Si tiene un asunto urgente cuando la clnica est cerrada y que no puede esperar hasta el siguiente da hbil, puede llamar/localizar a su doctor(a) al nmero que aparece a continuacin.   Por favor, tenga en cuenta que aunque hacemos todo lo posible para estar disponibles para asuntos urgentes fuera del horario de [place], no estamos disponibles las 24 horas del da, los 7 [address]  [address] de la [place].   Si tiene un problema urgente y no puede comunicarse con nosotros, puede optar por buscar atencin mdica  en el consultorio de su doctor(a), en una clnica privada, en un centro de atencin urgente o en una sala de emergencias.  Si tiene Engineer, drilling, por favor llame inmediatamente al 911 o vaya a la sala de emergencias.  Nmeros de bper  - Dr. Hester: [phone]  - Dra. Jackquline: [phone]  - Dr. Claudene: [phone]  - Dra. Kitts: [phone]  En caso de inclemencias del [place], por favor llame a nuestra lnea principal al [phone] para una actualizacin sobre el estado de cualquier retraso o cierre.  Consejos para la medicacin en dermatologa: Por favor, guarde las cajas en las que vienen los medicamentos de uso tpico para ayudarle a seguir las instrucciones sobre dnde y cmo usarlos. Las farmacias generalmente imprimen las instrucciones del medicamento slo en las cajas y no directamente en los tubos del [place].   Si su medicamento es muy caro, por favor, pngase en  contacto con landry rieger llamando al [phone] y presione la opcin 4 o envenos un mensaje a travs de Clinical cytogeneticist.   No podemos decirle cul ser su copago por los medicamentos por adelantado ya que esto es diferente dependiendo de la cobertura de su seguro. Sin embargo, es posible que podamos encontrar un medicamento sustituto a Audiological scientist un formulario para que el seguro cubra el medicamento que se considera necesario.   Si se requiere una autorizacin previa para que su compaa de seguros malta su medicamento, por favor permtanos de 1 a 2 das hbiles para completar este proceso.  Los precios de los medicamentos varan con frecuencia dependiendo del Environmental consultant de dnde se surte la receta y alguna farmacias pueden ofrecer precios ms baratos.  El sitio web www.goodrx.com tiene cupones para medicamentos de Health and safety inspector. Los precios aqu no tienen en cuenta lo que podra costar con la ayuda del seguro (puede ser ms barato con su seguro), pero el sitio web puede darle  el precio si no Visual merchandiser.  - Puede imprimir el cupn correspondiente y llevarlo con su receta a la farmacia.  - Tambin puede pasar por nuestra oficina durante el horario de atencin regular y Education officer, museum una tarjeta de cupones de GoodRx.  - Si necesita que su receta se enve electrnicamente a una farmacia diferente, informe a nuestra oficina a travs de MyChart de [place] o por telfono llamando al [phone] y presione la opcin 4.

## 2024-01-15 ENCOUNTER — Encounter: Payer: Self-pay | Admitting: General Practice

## 2024-01-15 ENCOUNTER — Ambulatory Visit: Admitting: General Practice

## 2024-01-15 VITALS — BP 122/70 | HR 67 | Temp 98.1°F | Ht 68.0 in | Wt 195.4 lb

## 2024-01-15 DIAGNOSIS — Z113 Encounter for screening for infections with a predominantly sexual mode of transmission: Secondary | ICD-10-CM | POA: Diagnosis not present

## 2024-01-15 DIAGNOSIS — R21 Rash and other nonspecific skin eruption: Secondary | ICD-10-CM

## 2024-01-15 NOTE — Patient Instructions (Addendum)
 Stop by the lab prior to leaving today. I will notify you of your results once received.   Start the Benzoyl peroxide wash once daily.   It was a pleasure to see you today!

## 2024-01-15 NOTE — Progress Notes (Addendum)
 Established Patient Office Visit  Subjective   Patient ID: Shawn Strickland, male    DOB: 12-06-1995  Age: 28 y.o. MRN: [id number]  Chief Complaint  Patient presents with  . Rash    Saw dermatology last Monday and was given clindamycin  gel; helped with the burning but rash is still present.     Rash Pertinent negatives include no diarrhea, fever, shortness of breath or vomiting.    Shawn Strickland is a 28 year old male with past medical history of dermatitis presents today for an acute visit.   Discussed the use of AI scribe software for clinical note transcription with the patient, who gave verbal consent to proceed.  History of Present Illness Shawn Strickland is a 28 year old male who presents with a ongoing rash that is continuing to spread.   He has been experiencing a rash for over two months, which initially started in the groin area and has now spread to his back and feet. The rash spreads in 'waves' and is not particularly itchy.   He visited a dermatologist on January 07, 2024. The examination was inconclusive as it was not during an active flare-up. He has been using clindamycin  and Aquaphor/Vaseline without significant improvement.He has not started using benzoyl peroxide.  He has undergone STI testing, including a swab for herpes, which was negative. He has not experienced symptoms such as fever or severe fatigue. No significant itching.  He is concerned about the rash potentially being related to an STI, as suggested by online searches, but notes that his partner has not exhibited any symptoms.  The rash has not been responsive to treatments tried so far, and it is impacting his life due to the ongoing nature and financial burden of seeking treatment.   Patient Active Problem List   Diagnosis Date Noted  . Rash 01/03/2024  . Establishing care with new doctor, encounter for 12/05/2023  . Encounter for screening and preventative care 12/05/2023  . Class 1 obesity due to  excess calories with body mass index (BMI) of 30.0 to 30.9 in adult 12/05/2023  . Facial fracture (HCC) 12/14/2019   Past Medical History:  Diagnosis Date  . Arthritis   . Blood in stool    Past Surgical History:  Procedure Laterality Date  . APPLICATION OF A-CELL OF EXTREMITY Bilateral 12/19/2019   Procedure: APPLICATION OF ACELL;  Surgeon: Elisabeth Craig RAMAN, MD;  Location: [facility] OR;  Service: Plastics;  Laterality: Bilateral;  . EXTERNAL FIXATION ARM Right 12/15/2019   Procedure: REDUCTION AND EXTERNAL FIXATION RIGHT WRIST;  Surgeon: Sebastian Lenis, MD;  Location: [facility] OR;  Service: Orthopedics;  Laterality: Right;  . I & D EXTREMITY Bilateral 12/19/2019   Procedure: IRRIGATION AND DEBRIDEMENT EXTREMITY BILATERAL ARMS AND LEGS;  Surgeon: Elisabeth Craig RAMAN, MD;  Location: [facility] OR;  Service: Plastics;  Laterality: Bilateral;  . INCISION AND DRAINAGE OF WOUND N/A 12/15/2019   Procedure: IRRIGATION AND DEBRIDEMENT OF BILATERAL LOWER EXTREMITIES, BILATERAL UPPER EXTREMITIES,  AND ABDOMINAL WALL;  Surgeon: Paola Dreama SAILOR, MD;  Location: [facility] OR;  Service: General;  Laterality: N/A;  . INCISION AND DRAINAGE OF WOUND N/A 12/19/2019   Procedure: IRRIGATION AND DEBRIDEMENT ABDOMEN;  Surgeon: Elisabeth Craig RAMAN, MD;  Location: [facility] OR;  Service: Plastics;  Laterality: N/A;   No Known Allergies       01/15/2024    9:01 AM 01/03/2024    9:57 AM 12/20/2023   12:03 PM  Depression screen PHQ 2/9  Decreased Interest  0 0 0  Down, Depressed, Hopeless 0 0 0  PHQ - 2 Score 0 0 0  Altered sleeping 0 0 0  Tired, decreased energy 0 0 0  Change in appetite 0 0 0  Feeling bad or failure about yourself  0 0 0  Trouble concentrating 0 0 0  Moving slowly or fidgety/restless 0 0 0  Suicidal thoughts 0 0 0  PHQ-9 Score 0 0 0  Difficult doing work/chores Not difficult at all Not difficult at all Not difficult at all       01/15/2024    9:01 AM 01/03/2024    9:57 AM 12/20/2023   12:03 PM 12/05/2023   10:07 AM  GAD 7 :  Generalized Anxiety Score  Nervous, Anxious, on Edge 0 0 0 1  Control/stop worrying 0 0 0 1  Worry too much - different things 0 0 1 0  Trouble relaxing 0 0 0 0  Restless 0 0 0 0  Easily annoyed or irritable 0 0 1 1  Afraid - awful might happen 0 0 0 0  Total GAD 7 Score 0 0 2 3  Anxiety Difficulty Not difficult at all Not difficult at all Not difficult at all Not difficult at all      Review of Systems  Constitutional:  Negative for chills and fever.  Respiratory:  Negative for shortness of breath.   Cardiovascular:  Negative for chest pain.  Gastrointestinal:  Negative for abdominal pain, constipation, diarrhea, heartburn, nausea and vomiting.  Genitourinary:  Negative for dysuria, frequency and urgency.  Skin:  Positive for rash.  Neurological:  Negative for dizziness and headaches.  Endo/Heme/Allergies:  Negative for polydipsia.  Psychiatric/Behavioral:  Negative for depression and suicidal ideas. The patient is not nervous/anxious.       Objective:     BP 122/70   Pulse 67   Temp 98.1 F (36.7 C) (Oral)   Ht 5' 8 (1.727 m)   Wt 195 lb 6.4 oz (88.6 kg)   SpO2 98%   BMI 29.71 kg/m  BP Readings from Last 3 Encounters:  01/15/24 122/70  01/03/24 122/60  12/20/23 128/70   Wt Readings from Last 3 Encounters:  01/15/24 195 lb 6.4 oz (88.6 kg)  01/03/24 196 lb (88.9 kg)  12/20/23 196 lb (88.9 kg)      Physical Exam Vitals and nursing note reviewed.  Constitutional:      Appearance: Normal appearance.  Cardiovascular:     Rate and Rhythm: Normal rate and regular rhythm.     Pulses: Normal pulses.     Heart sounds: Normal heart sounds.  Pulmonary:     Effort: Pulmonary effort is normal.     Breath sounds: Normal breath sounds.  Neurological:     Mental Status: He is alert and oriented to person, place, and time.  Psychiatric:        Mood and Affect: Mood normal.        Behavior: Behavior normal.        Thought Content: Thought content normal.         Judgment: Judgment normal.      Results for orders placed or performed in visit on 01/15/24  Chlamydia/Gonococcus/Trichomonas, NAA   Specimen: Blood   BLD  Result Value Ref Range   Chlamydia by NAA Negative Negative   Gonococcus by NAA Negative Negative   Trich vag by NAA Negative Negative  RPR  Result Value Ref Range   RPR Ser Ql NON-REACTIVE NON-REACTIVE  HIV Antibody (routine testing w rflx)  Result Value Ref Range   HIV FINAL INTERPRETATION HIV NEGATIVE    HIV 1&2 Ab, 4th Generation NON-REACTIVE NON-REACTIVE  Herpes Simplex Virus 1 and 2 (IgG), with Reflex to HSV-2 Inhibition  Result Value Ref Range   HSV 1 IGG,TYPE SPECIFIC AB <0.90 index   HSV 2 IGG,TYPE SPECIFIC AB <0.90 index       The ASCVD Risk score (Arnett DK, et al., 2019) failed to calculate for the following reasons:   The 2019 ASCVD risk score is only valid for ages 13 to 37    Assessment & Plan:  Rash -     RPR -     HIV Antibody (routine testing w rflx) -     Chlamydia/Gonococcus/Trichomonas, NAA -     HSV(herpes simplex vrs) 1+2 ab-IgG  Screening examination for STI -     Herpes Simplex Virus 1 and 2 (IgG), with Reflex to HSV-2 Inhibition    Assessment and Plan Assessment & Plan Chronic spreading rash of unclear etiology Chronic rash spreading from groin and perineal area. Differential includes folliculitis, adult-onset acne, allergic reaction, and STI. Dermatology consultation inconclusive. Previous treatments ineffective.  - Order blood tests for syphilis, HIV, chlamydia, gonorrhea, trichomonas, and HSV 1 and 2 antibodies. - Start benzoyl peroxide 5% wash once daily. - Consider doxycycline  for 7 days if blood tests are negative. - Monitor rash for changes in shape or blistering and communicate via MyChart.   Return if symptoms worsen or fail to improve.    Carrol Aurora, NP

## 2024-01-17 ENCOUNTER — Ambulatory Visit: Payer: Self-pay | Admitting: General Practice

## 2024-01-17 LAB — HIV ANTIBODY (ROUTINE TESTING W REFLEX)
HIV 1&2 Ab, 4th Generation: NONREACTIVE
HIV FINAL INTERPRETATION: NEGATIVE

## 2024-01-17 LAB — RPR: RPR Ser Ql: NONREACTIVE

## 2024-01-17 LAB — HERPES SIMPLEX VIRUS 1 AND 2 (IGG),REFLEX HSV-2 INHIBITION
HSV 1 IGG,TYPE SPECIFIC AB: <0.9 {index}
HSV 2 IGG,TYPE SPECIFIC AB: <0.9 {index}

## 2024-01-19 LAB — CHLAMYDIA/GONOCOCCUS/TRICHOMONAS, NAA
Chlamydia by NAA: NEGATIVE
Gonococcus by NAA: NEGATIVE
Trich vag by NAA: NEGATIVE

## 2024-01-21 ENCOUNTER — Other Ambulatory Visit: Payer: Self-pay | Admitting: General Practice

## 2024-01-21 DIAGNOSIS — R21 Rash and other nonspecific skin eruption: Secondary | ICD-10-CM

## 2024-02-18 DIAGNOSIS — H9203 Otalgia, bilateral: Secondary | ICD-10-CM | POA: Diagnosis not present

## 2024-03-07 ENCOUNTER — Encounter: Payer: Self-pay | Admitting: General Practice

## 2024-03-07 ENCOUNTER — Ambulatory Visit: Admitting: General Practice

## 2024-03-07 ENCOUNTER — Ambulatory Visit: Payer: Self-pay | Admitting: General Practice

## 2024-03-07 VITALS — BP 120/82 | HR 86 | Temp 98.3°F | Ht 68.0 in | Wt 203.0 lb

## 2024-03-07 DIAGNOSIS — E781 Pure hyperglyceridemia: Secondary | ICD-10-CM | POA: Diagnosis not present

## 2024-03-07 DIAGNOSIS — R21 Rash and other nonspecific skin eruption: Secondary | ICD-10-CM | POA: Diagnosis not present

## 2024-03-07 DIAGNOSIS — R7989 Other specified abnormal findings of blood chemistry: Secondary | ICD-10-CM

## 2024-03-07 LAB — HEPATIC FUNCTION PANEL
ALT: 32 U/L (ref 0–53)
AST: 28 U/L (ref 0–37)
Albumin: 4.8 g/dL (ref 3.5–5.2)
Alkaline Phosphatase: 67 U/L (ref 39–117)
Bilirubin, Direct: 0.1 mg/dL (ref 0.0–0.3)
Total Bilirubin: 0.5 mg/dL (ref 0.2–1.2)
Total Protein: 7.3 g/dL (ref 6.0–8.3)

## 2024-03-07 NOTE — Patient Instructions (Addendum)
 Stop by the lab prior to leaving today. I will notify you of your results once received.    Continue monitor diet and alcohol intake.   Follow up in August, 2026 for physical and fasting labs.   It was a pleasure to see you today!
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
## 2024-03-11 LAB — INTERPRETATION:

## 2024-03-11 LAB — ALPHA-GAL PANEL
Allergen, Mutton, f88: <0.1 kU/L
Allergen, Pork, f26: <0.1 kU/L
Beef: <0.1 kU/L
CLASS: 0
CLASS: 0
Class: 0
GALACTOSE-ALPHA-1,3-GALACTOSE IGE*: 0.13 kU/L — ABNORMAL HIGH (ref <0.10)

## 2024-03-12 NOTE — Telephone Encounter (Signed)
 Copied from CRM [id number]. Topic: General - Other >> Mar 12, 2024 10:31 AM Alfonso HERO wrote: Reason for CRM: patient returning NP Kaur's call.

## 2024-03-12 NOTE — Telephone Encounter (Signed)
 Discussed alpha gal results with patient. Verbalizes understanding.  Advised to avoid lamb, pork and beef products. Advised to read labels.  Declines allergist referral at this time.

## 2024-05-21 ENCOUNTER — Ambulatory Visit (HOSPITAL_COMMUNITY)
Admission: EM | Admit: 2024-05-21 | Discharge: 2024-05-21 | Disposition: A | Discharge status: Home / Self Care | Attending: Family Medicine | Admitting: Family Medicine

## 2024-05-21 ENCOUNTER — Encounter (HOSPITAL_COMMUNITY): Payer: Self-pay

## 2024-05-21 DIAGNOSIS — J029 Acute pharyngitis, unspecified: Secondary | ICD-10-CM | POA: Insufficient documentation

## 2024-05-21 DIAGNOSIS — H1033 Unspecified acute conjunctivitis, bilateral: Secondary | ICD-10-CM | POA: Insufficient documentation

## 2024-05-21 DIAGNOSIS — J069 Acute upper respiratory infection, unspecified: Secondary | ICD-10-CM | POA: Insufficient documentation

## 2024-05-21 LAB — POCT RAPID STREP A (OFFICE): Rapid Strep A Screen: NEGATIVE

## 2024-05-21 MED ORDER — PROMETHAZINE-DM 6.25-15 MG/5ML PO SYRP: 5.0000 mL | ORAL_SOLUTION | Freq: Every evening | ORAL | 0 refills | Status: AC | PRN

## 2024-05-21 MED ORDER — ERYTHROMYCIN 5 MG/GM OP OINT: TOPICAL_OINTMENT | OPHTHALMIC | 0 refills | Status: AC

## 2024-05-21 NOTE — Discharge Instructions (Addendum)
 Your strep swab is negative.  It will be sent for culture.  If culture is positive, you will be contacted via telephone.    You have bacterial conjunctivitis.  You have been given a prescription for erythromycin  ointment.  Use this as directed for the next 7 days.  You have a viral illness which will improve on its own with rest, fluids, and medications to help with your symptoms.  Tylenol  and/or ibuprofen  for fever, body aches, pain Guaifenesin (plain mucinex) for cough, congestion Saline nasal sprays for nasal congestion  Two teaspoons of honey in 1 cup of warm water every 4-6 hours may help with throat pains.  Salt water gargles, 1/2-1 teaspoon of salt dissolved in 1 cup of warm water, gargle and spit out every 4-6 hours may help with throat pains.  Humidifier in room at nighttime may help soothe cough (clean well daily).   Take Promethazine  DM cough medication to help with your cough at nighttime so that you are able to sleep. Do not drive, drink alcohol, or go to work while taking this medication since it can make you sleepy. Only take this at nighttime.   For chest pain, shortness of breath, inability to keep food or fluids down without vomiting, fever that does not respond to tylenol  or motrin , or any other severe symptoms, please go to the ER for further evaluation. Return to urgent care as needed, otherwise follow-up with PCP.

## 2024-05-21 NOTE — ED Notes (Signed)
 Reviewed work note, patient states understanding

## 2024-05-21 NOTE — ED Provider Notes (Signed)
 " [facility]    CSN: [id number] Arrival date & time: 05/21/24  0820      History   Chief Complaint Chief Complaint  Patient presents with   Conjunctivitis   Nasal Congestion   Sore Throat   Cough    HPI Shawn Strickland is a 29 y.o. male.   This 29 year old male is being seen for complaints of headache, productive cough, sore throat, body aches, subjective fever, chills ongoing for 5 days.  He also reports bilateral eye discomfort, erythema, drainage and crusting onset yesterday.  He reports some nausea and vomiting over the weekend, however this has since resolved.  He denies dizziness, nasal congestion.  He denies chest pain, shortness of breath, abdominal pain, nausea, vomiting, diarrhea.   Conjunctivitis Associated symptoms include headaches. Pertinent negatives include no chest pain, no abdominal pain and no shortness of breath.  Sore Throat Associated symptoms include headaches. Pertinent negatives include no chest pain, no abdominal pain and no shortness of breath.  Cough Associated symptoms: chills, eye discharge, fever, headaches, myalgias and sore throat   Associated symptoms: no chest pain, no ear pain, no rash, no rhinorrhea and no shortness of breath     Past Medical History:  Diagnosis Date   Arthritis    Blood in stool     Patient Active Problem List   Diagnosis Date Noted   Abnormal LFTs 03/07/2024   Hypertriglyceridemia 03/07/2024   Rash 01/03/2024   Establishing care with new doctor, encounter for 12/05/2023   Encounter for screening and preventative care 12/05/2023   Class 1 obesity due to excess calories with body mass index (BMI) of 30.0 to 30.9 in adult 12/05/2023   Facial fracture (HCC) 12/14/2019    Past Surgical History:  Procedure Laterality Date   APPLICATION OF A-CELL OF EXTREMITY Bilateral 12/19/2019   Procedure: APPLICATION OF ACELL;  Surgeon: Elisabeth Craig RAMAN, MD;  Location: [facility] OR;  Service: Plastics;  Laterality: Bilateral;    EXTERNAL FIXATION ARM Right 12/15/2019   Procedure: REDUCTION AND EXTERNAL FIXATION RIGHT WRIST;  Surgeon: Sebastian Lenis, MD;  Location: [facility] OR;  Service: Orthopedics;  Laterality: Right;   I & D EXTREMITY Bilateral 12/19/2019   Procedure: IRRIGATION AND DEBRIDEMENT EXTREMITY BILATERAL ARMS AND LEGS;  Surgeon: Elisabeth Craig RAMAN, MD;  Location: [facility] OR;  Service: Plastics;  Laterality: Bilateral;   INCISION AND DRAINAGE OF WOUND N/A 12/15/2019   Procedure: IRRIGATION AND DEBRIDEMENT OF BILATERAL LOWER EXTREMITIES, BILATERAL UPPER EXTREMITIES,  AND ABDOMINAL WALL;  Surgeon: Paola Dreama SAILOR, MD;  Location: [facility] OR;  Service: General;  Laterality: N/A;   INCISION AND DRAINAGE OF WOUND N/A 12/19/2019   Procedure: IRRIGATION AND DEBRIDEMENT ABDOMEN;  Surgeon: Elisabeth Craig RAMAN, MD;  Location: [facility] OR;  Service: Plastics;  Laterality: N/A;       Home Medications    Prior to Admission medications  Medication Sig Start Date End Date Taking? Authorizing Provider  erythromycin  ophthalmic ointment Place a 1/2 inch ribbon of ointment into both lower eyelids 4 times a day for 7 days. 05/21/24  Yes Labradford Schnitker C, FNP  promethazine -dextromethorphan (PROMETHAZINE -DM) 6.25-15 MG/5ML syrup Take 5 mLs by mouth at bedtime as needed for cough. 05/21/24  Yes Lennice Jon BROCKS, FNP    Family History History reviewed. No pertinent family history.  Social History Social History[1]   Allergies   Patient has no known allergies.   Review of Systems Review of Systems  Constitutional:  Positive for activity change, chills and fever. Negative  for appetite change.  HENT:  Positive for sore throat. Negative for congestion, ear pain and rhinorrhea.   Eyes:  Positive for pain, discharge, redness and itching. Negative for visual disturbance.  Respiratory:  Positive for cough. Negative for shortness of breath.   Cardiovascular:  Negative for chest pain.  Gastrointestinal:  Negative for abdominal pain, diarrhea, nausea and  vomiting.  Musculoskeletal:  Positive for myalgias.  Skin:  Negative for color change and rash.  Neurological:  Positive for headaches. Negative for dizziness.     Physical Exam Triage Vital Signs ED Triage Vitals [05/21/24 0852]  Encounter Vitals Group     BP 124/68     Girls Systolic BP Percentile      Girls Diastolic BP Percentile      Boys Systolic BP Percentile      Boys Diastolic BP Percentile      Pulse Rate 89     Resp 18     Temp 98.5 F (36.9 C)     Temp Source Oral     SpO2 98 %     Weight      Height      Head Circumference      Peak Flow      Pain Score      Pain Loc      Pain Education      Exclude from Growth Chart    No data found.  Updated Vital Signs BP 124/68 (BP Location: Left Arm)   Pulse 89   Temp 98.5 F (36.9 C) (Oral)   Resp 18   SpO2 98%   Visual Acuity Right Eye Distance:   Left Eye Distance:   Bilateral Distance:    Right Eye Near:   Left Eye Near:    Bilateral Near:     Physical Exam Vitals and nursing note reviewed.  Constitutional:      General: He is not in acute distress.    Appearance: He is well-developed. He is not toxic-appearing.     Comments: Pleasant male appearing stated age found sitting in chair in no acute distress.  HENT:     Head: Normocephalic and atraumatic.     Right Ear: Tympanic membrane and external ear normal.     Left Ear: Tympanic membrane and external ear normal.     Nose: Nose normal.     Mouth/Throat:     Lips: Pink.     Mouth: Mucous membranes are moist.     Pharynx: Posterior oropharyngeal erythema present. No pharyngeal swelling or oropharyngeal exudate.  Eyes:     General: Lids are normal.     Conjunctiva/sclera:     Right eye: Right conjunctiva is injected.     Left eye: Left conjunctiva is injected.  Cardiovascular:     Rate and Rhythm: Normal rate and regular rhythm.     Heart sounds: Normal heart sounds. No murmur heard. Pulmonary:     Effort: Pulmonary effort is normal. No  respiratory distress.     Breath sounds: Normal breath sounds.  Abdominal:     General: Bowel sounds are normal.     Palpations: Abdomen is soft.     Tenderness: There is no abdominal tenderness.  Skin:    General: Skin is warm and dry.     Capillary Refill: Capillary refill takes less than 2 seconds.  Neurological:     Mental Status: He is alert.  Psychiatric:        Mood and Affect: Mood normal.  UC Treatments / Results  Labs (all labs ordered are listed, but only abnormal results are displayed) Labs Reviewed  POCT RAPID STREP A (OFFICE) - Normal  CULTURE, GROUP A STREP [facility])    EKG   Radiology No results found.  Procedures Procedures (including critical care time)  Medications Ordered in UC Medications - No data to display  Initial Impression / Assessment and Plan / UC Course  I have reviewed the triage vital signs and the nursing notes.  Pertinent labs & imaging results that were available during my care of the patient were reviewed by me and considered in my medical decision making (see chart for details).     Vitals and triage reviewed, patient is hemodynamically stable.  Due to duration of symptoms, COVID and flu swabs are deferred at this time.  Strep swab obtained and is negative.  Throat culture sent.  Presentation consistent with viral illness and conjunctivitis.  Prescription for erythromycin  given.  Prescription for promethazine -DM given.  He is advised supportive care with Tylenol  and/or ibuprofen , guaifenesin, saline nasal spray.  Advised honey water, salt water gargles, cool-mist humidifier.  Plan of care, follow-up care, return precautions given, no questions at this time.  Work note provided. Final Clinical Impressions(s) / UC Diagnoses   Final diagnoses:  Sore throat  Acute bacterial conjunctivitis of both eyes  Viral URI with cough     Discharge Instructions      Your strep swab is negative.  It will be sent for culture.  If culture is  positive, you will be contacted via telephone.    You have bacterial conjunctivitis.  You have been given a prescription for erythromycin  ointment.  Use this as directed for the next 7 days.  You have a viral illness which will improve on its own with rest, fluids, and medications to help with your symptoms.  Tylenol  and/or ibuprofen  for fever, body aches, pain Guaifenesin (plain mucinex) for cough, congestion Saline nasal sprays for nasal congestion  Two teaspoons of honey in 1 cup of warm water every 4-6 hours may help with throat pains.  Salt water gargles, 1/2-1 teaspoon of salt dissolved in 1 cup of warm water, gargle and spit out every 4-6 hours may help with throat pains.  Humidifier in room at nighttime may help soothe cough (clean well daily).   Take Promethazine  DM cough medication to help with your cough at nighttime so that you are able to sleep. Do not drive, drink alcohol, or go to work while taking this medication since it can make you sleepy. Only take this at nighttime.   For chest pain, shortness of breath, inability to keep food or fluids down without vomiting, fever that does not respond to tylenol  or motrin , or any other severe symptoms, please go to the ER for further evaluation. Return to urgent care as needed, otherwise follow-up with PCP.       ED Prescriptions     Medication Sig Dispense Auth. Provider   erythromycin  ophthalmic ointment Place a 1/2 inch ribbon of ointment into both lower eyelids 4 times a day for 7 days. 3.5 g Jessyca Sloan C, FNP   promethazine -dextromethorphan (PROMETHAZINE -DM) 6.25-15 MG/5ML syrup Take 5 mLs by mouth at bedtime as needed for cough. 118 mL Miquel Stacks C, FNP      PDMP not reviewed this encounter.    [1]  Social History Tobacco Use   Smoking status: Never   Smokeless tobacco: Never  Vaping Use   Vaping status: Every  Day   Substances: Nicotine  Substance Use Topics   Alcohol use: Yes    Alcohol/week: 2.0  standard drinks of alcohol    Types: 2 Standard drinks or equivalent per week    Comment: Occasionally   Drug use: Never     Lennice Jon BROCKS, FNP 05/21/24 1022  "

## 2024-05-21 NOTE — ED Triage Notes (Signed)
 Pt presents to the office for bilateral pink eye,coughing up thick white mucous x 1 week.

## 2024-05-22 ENCOUNTER — Ambulatory Visit (HOSPITAL_COMMUNITY)

## 2024-05-23 ENCOUNTER — Ambulatory Visit (HOSPITAL_COMMUNITY): Payer: Self-pay

## 2024-05-23 LAB — CULTURE, GROUP A STREP (THRC)

## 2024-12-05 ENCOUNTER — Encounter: Admitting: General Practice
